# Patient Record
Sex: Male | Born: 1950 | Race: White | Hispanic: No | Marital: Married | Smoking: Never smoker
Health system: Southern US, Community
[De-identification: ages and names within clinical notes are randomized; demographics above are authoritative.]

## PROBLEM LIST (undated history)

## (undated) DIAGNOSIS — I219 Acute myocardial infarction, unspecified: Secondary | ICD-10-CM

## (undated) DIAGNOSIS — E119 Type 2 diabetes mellitus without complications: Secondary | ICD-10-CM

## (undated) DIAGNOSIS — E785 Hyperlipidemia, unspecified: Secondary | ICD-10-CM

## (undated) DIAGNOSIS — I10 Essential (primary) hypertension: Secondary | ICD-10-CM

## (undated) DIAGNOSIS — R0789 Other chest pain: Secondary | ICD-10-CM

## (undated) DIAGNOSIS — G8918 Other acute postprocedural pain: Secondary | ICD-10-CM

## (undated) DIAGNOSIS — I2510 Atherosclerotic heart disease of native coronary artery without angina pectoris: Secondary | ICD-10-CM

## (undated) DIAGNOSIS — R0600 Dyspnea, unspecified: Secondary | ICD-10-CM

## (undated) DIAGNOSIS — J90 Pleural effusion, not elsewhere classified: Secondary | ICD-10-CM

## (undated) HISTORY — DX: Type 2 diabetes mellitus without complications: E11.9

## (undated) HISTORY — DX: Other chest pain: R07.89

## (undated) HISTORY — DX: Other acute postprocedural pain: G89.18

## (undated) HISTORY — DX: Hyperlipidemia, unspecified: E78.5

## (undated) HISTORY — PX: OTHER SURGICAL HISTORY: SHX169

## (undated) HISTORY — DX: Atherosclerotic heart disease of native coronary artery without angina pectoris: I25.10

## (undated) HISTORY — DX: Essential (primary) hypertension: I10

## (undated) HISTORY — DX: Pleural effusion, not elsewhere classified: J90

---

## 1999-11-21 ENCOUNTER — Ambulatory Visit (HOSPITAL_COMMUNITY): Admission: RE | Admit: 1999-11-21 | Discharge: 1999-11-21 | Payer: Self-pay | Admitting: *Deleted

## 1999-11-21 ENCOUNTER — Encounter: Payer: Self-pay | Admitting: *Deleted

## 1999-12-06 ENCOUNTER — Ambulatory Visit (HOSPITAL_COMMUNITY): Admission: RE | Admit: 1999-12-06 | Discharge: 1999-12-06 | Payer: Self-pay | Admitting: *Deleted

## 1999-12-06 ENCOUNTER — Encounter: Payer: Self-pay | Admitting: *Deleted

## 1999-12-20 ENCOUNTER — Ambulatory Visit (HOSPITAL_COMMUNITY): Admission: RE | Admit: 1999-12-20 | Discharge: 1999-12-20 | Payer: Self-pay | Admitting: *Deleted

## 1999-12-20 ENCOUNTER — Encounter: Payer: Self-pay | Admitting: *Deleted

## 2000-04-30 ENCOUNTER — Encounter: Payer: Self-pay | Admitting: Neurosurgery

## 2000-04-30 ENCOUNTER — Ambulatory Visit (HOSPITAL_COMMUNITY): Admission: RE | Admit: 2000-04-30 | Discharge: 2000-04-30 | Payer: Self-pay | Admitting: Neurosurgery

## 2003-10-28 ENCOUNTER — Emergency Department (HOSPITAL_COMMUNITY): Admission: AD | Admit: 2003-10-28 | Discharge: 2003-10-28 | Payer: Self-pay | Admitting: Internal Medicine

## 2005-11-28 ENCOUNTER — Ambulatory Visit (HOSPITAL_COMMUNITY): Admission: RE | Admit: 2005-11-28 | Discharge: 2005-11-28 | Payer: Self-pay | Admitting: Orthopedic Surgery

## 2005-12-29 ENCOUNTER — Encounter: Admission: RE | Admit: 2005-12-29 | Discharge: 2005-12-29 | Payer: Self-pay | Admitting: Neurosurgery

## 2010-03-01 HISTORY — PX: OTHER SURGICAL HISTORY: SHX169

## 2010-03-29 ENCOUNTER — Encounter: Admission: RE | Admit: 2010-03-29 | Discharge: 2010-03-29 | Payer: Self-pay | Admitting: Family Medicine

## 2010-04-23 ENCOUNTER — Encounter: Admission: RE | Admit: 2010-04-23 | Discharge: 2010-04-23 | Payer: Self-pay | Admitting: Orthopedic Surgery

## 2010-04-27 ENCOUNTER — Inpatient Hospital Stay (HOSPITAL_COMMUNITY): Admission: EM | Admit: 2010-04-27 | Discharge: 2010-05-04 | Payer: Self-pay | Admitting: Emergency Medicine

## 2010-04-27 ENCOUNTER — Encounter (INDEPENDENT_AMBULATORY_CARE_PROVIDER_SITE_OTHER): Payer: Self-pay | Admitting: Internal Medicine

## 2010-04-27 HISTORY — PX: TRANSTHORACIC ECHOCARDIOGRAM: SHX275

## 2010-04-29 ENCOUNTER — Encounter: Payer: Self-pay | Admitting: Cardiothoracic Surgery

## 2010-04-29 ENCOUNTER — Ambulatory Visit: Payer: Self-pay | Admitting: Cardiothoracic Surgery

## 2010-04-29 HISTORY — PX: CARDIAC CATHETERIZATION: SHX172

## 2010-04-29 HISTORY — PX: OTHER SURGICAL HISTORY: SHX169

## 2010-04-30 HISTORY — PX: CORONARY ARTERY BYPASS GRAFT: SHX141

## 2010-05-01 HISTORY — PX: OTHER SURGICAL HISTORY: SHX169

## 2010-05-08 ENCOUNTER — Ambulatory Visit: Payer: Self-pay | Admitting: Cardiothoracic Surgery

## 2010-05-08 ENCOUNTER — Encounter: Admission: RE | Admit: 2010-05-08 | Discharge: 2010-05-08 | Payer: Self-pay | Admitting: Cardiothoracic Surgery

## 2010-05-10 ENCOUNTER — Ambulatory Visit: Payer: Self-pay | Admitting: Cardiothoracic Surgery

## 2010-05-13 ENCOUNTER — Encounter: Admission: RE | Admit: 2010-05-13 | Discharge: 2010-05-13 | Payer: Self-pay | Admitting: Cardiovascular Disease

## 2010-05-16 ENCOUNTER — Inpatient Hospital Stay (HOSPITAL_COMMUNITY): Admission: EM | Admit: 2010-05-16 | Discharge: 2010-05-18 | Payer: Self-pay | Admitting: Emergency Medicine

## 2010-05-27 ENCOUNTER — Ambulatory Visit: Payer: Self-pay | Admitting: Cardiothoracic Surgery

## 2010-05-27 ENCOUNTER — Encounter: Admission: RE | Admit: 2010-05-27 | Discharge: 2010-05-27 | Payer: Self-pay | Admitting: Cardiothoracic Surgery

## 2010-06-20 ENCOUNTER — Encounter (HOSPITAL_COMMUNITY)
Admission: RE | Admit: 2010-06-20 | Discharge: 2010-08-17 | Payer: Self-pay | Source: Home / Self Care | Attending: Cardiovascular Disease | Admitting: Cardiovascular Disease

## 2010-07-26 ENCOUNTER — Encounter
Admission: RE | Admit: 2010-07-26 | Discharge: 2010-07-26 | Payer: Self-pay | Source: Home / Self Care | Attending: Cardiovascular Disease | Admitting: Cardiovascular Disease

## 2010-08-14 ENCOUNTER — Ambulatory Visit: Payer: Self-pay | Admitting: Cardiothoracic Surgery

## 2010-09-04 ENCOUNTER — Ambulatory Visit
Admission: RE | Admit: 2010-09-04 | Discharge: 2010-09-04 | Payer: Self-pay | Source: Home / Self Care | Attending: Cardiothoracic Surgery | Admitting: Cardiothoracic Surgery

## 2010-09-08 ENCOUNTER — Encounter: Payer: Self-pay | Admitting: Cardiothoracic Surgery

## 2010-09-11 ENCOUNTER — Ambulatory Visit: Admit: 2010-09-11 | Payer: Self-pay | Admitting: Cardiothoracic Surgery

## 2010-09-25 ENCOUNTER — Ambulatory Visit (INDEPENDENT_AMBULATORY_CARE_PROVIDER_SITE_OTHER): Payer: BC Managed Care – PPO | Admitting: Cardiothoracic Surgery

## 2010-09-25 DIAGNOSIS — I2510 Atherosclerotic heart disease of native coronary artery without angina pectoris: Secondary | ICD-10-CM

## 2010-09-27 NOTE — Assessment & Plan Note (Signed)
OFFICE VISIT  Adrian [name], Adrian [name] DOB:  1951/05/13                                        September 26, 2010 CHART #:  [id number]  CURRENT PROBLEMS: 1. Chest wall pain status post coronary artery bypass graft, April 30, 2010. 2. 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 3. Postoperative left pleural effusion, resolved.  CURRENT MEDICATIONS: 1. Aspirin 81 mg p.o. daily. 2. Lipitor 20 mg daily. 3. Xanax 1.25 mg daily. 4. Losartan 100 mg daily. 5. Oxycodone p.r.n. pain.  HISTORY OF PRESENT ILLNESS:  The patient returns to review and discuss his postoperative chest wall pain.  He has been unable to do any pushing, lifting, and has trouble lifting his arms over his head and has trouble raising his arms to drive extended distances.  This is due to diffuse anterior chest pain.  He is very sensitive to touch.  It is my impression that the amount of clicks that he is experiencing has improved.  However, he is unable to lift or function at his job and he is now unemployed.  He is not taking much narcotic.  We tried him on [name] course of Cymbalta for nerve-type pain; however, this was not effective at all.  In fact, he felt it made his symptoms worse.  The wound is completely healed, and there has never been signs of infection.  He has no evidence of angina and no shortness of breath.  PHYSICAL EXAMINATION:  Blood pressure 150/90, pulse 60, respirations 20, saturation on room air 90%.  Breath sounds are clear.  The sternum is well healed and I elicit no instability.  He is tender over the left greater than right upper chest wall with hypersensitivity as well.  He has no lower extremity edema.  IMPRESSION AND PLAN:  The patient has chest wall pain and inability to use his arms.  We tried nonnarcotic medication as well as Cymbalta without improvement.  We will make referral to Dr. Wynn Banker for evaluation at the pain clinic.  I have offered sternal  rewiring for the minimal amount of manubrial separation; however, the patient declines this and I am not confident that his diffuse chest wall pain would be better with sternal rewiring.  At this point, I have continued to advise him not to lift anything more than 15-20 pounds.  He will probably try to follow for disability and I told him I would provide [name] letter in support of that.  I will plan on seeing him back in approximately 6-8 weeks.  Kerin Perna, M.D. Electronically Signed  PV/MEDQ  D:  09/26/2010  T:  09/27/2010  Job:  [id number]  cc:   Thurmon Fair, MD

## 2010-10-31 LAB — BASIC METABOLIC PANEL
BUN: 16 mg/dL (ref 6–23)
BUN: 22 mg/dL (ref 6–23)
BUN: 22 mg/dL (ref 6–23)
BUN: 22 mg/dL (ref 6–23)
BUN: 12 mg/dL (ref 6–23)
BUN: 13 mg/dL (ref 6–23)
BUN: 16 mg/dL (ref 6–23)
CO2: 25 mEq/L (ref 19–32)
CO2: 26 mEq/L (ref 19–32)
CO2: 28 mEq/L (ref 19–32)
CO2: 30 mEq/L (ref 19–32)
CO2: 25 mEq/L (ref 19–32)
CO2: 28 mEq/L (ref 19–32)
Calcium: 8.3 mg/dL — ABNORMAL LOW (ref 8.4–10.5)
Calcium: 8.4 mg/dL (ref 8.4–10.5)
Calcium: 9.1 mg/dL (ref 8.4–10.5)
Calcium: 9.2 mg/dL (ref 8.4–10.5)
Calcium: 7.9 mg/dL — ABNORMAL LOW (ref 8.4–10.5)
Calcium: 9 mg/dL (ref 8.4–10.5)
Calcium: 9.1 mg/dL (ref 8.4–10.5)
Chloride: 103 mEq/L (ref 96–112)
Chloride: 103 mEq/L (ref 96–112)
Chloride: 104 mEq/L (ref 96–112)
Chloride: 106 mEq/L (ref 96–112)
Chloride: 104 mEq/L (ref 96–112)
Chloride: 111 mEq/L (ref 96–112)
Creatinine, Ser: 0.99 mg/dL (ref 0.4–1.5)
Creatinine, Ser: 1.03 mg/dL (ref 0.4–1.5)
Creatinine, Ser: 1.19 mg/dL (ref 0.4–1.5)
Creatinine, Ser: 1.22 mg/dL (ref 0.4–1.5)
Creatinine, Ser: 0.91 mg/dL (ref 0.4–1.5)
Creatinine, Ser: 1.08 mg/dL (ref 0.4–1.5)
Creatinine, Ser: 1.11 mg/dL (ref 0.4–1.5)
GFR calc Af Amer: >60 mL/min (ref >60)
GFR calc Af Amer: >60 mL/min (ref >60)
GFR calc Af Amer: >60 mL/min (ref >60)
GFR calc Af Amer: >60 mL/min (ref >60)
GFR calc Af Amer: >60 mL/min (ref >60)
GFR calc Af Amer: >60 mL/min (ref >60)
GFR calc Af Amer: >60 mL/min (ref >60)
GFR calc non Af Amer: >60 mL/min (ref >60)
GFR calc non Af Amer: >60 mL/min (ref >60)
GFR calc non Af Amer: >60 mL/min (ref >60)
GFR calc non Af Amer: >60 mL/min (ref >60)
GFR calc non Af Amer: >60 mL/min (ref >60)
GFR calc non Af Amer: >60 mL/min (ref >60)
Glucose, Bld: 108 mg/dL — ABNORMAL HIGH (ref 70–99)
Glucose, Bld: 111 mg/dL — ABNORMAL HIGH (ref 70–99)
Glucose, Bld: 115 mg/dL — ABNORMAL HIGH (ref 70–99)
Glucose, Bld: 121 mg/dL — ABNORMAL HIGH (ref 70–99)
Glucose, Bld: 135 mg/dL — ABNORMAL HIGH (ref 70–99)
Glucose, Bld: 93 mg/dL (ref 70–99)
Potassium: 3.9 mEq/L (ref 3.5–5.1)
Potassium: 4 mEq/L (ref 3.5–5.1)
Potassium: 4.2 mEq/L (ref 3.5–5.1)
Potassium: 3.9 mEq/L (ref 3.5–5.1)
Potassium: 4 mEq/L (ref 3.5–5.1)
Sodium: 137 mEq/L (ref 135–145)
Sodium: 140 mEq/L (ref 135–145)
Sodium: 140 mEq/L (ref 135–145)
Sodium: 140 mEq/L (ref 135–145)
Sodium: 140 mEq/L (ref 135–145)

## 2010-10-31 LAB — POCT I-STAT, CHEM 8
BUN: 13 mg/dL (ref 6–23)
BUN: 29 mg/dL — ABNORMAL HIGH (ref 6–23)
Calcium, Ion: 1.12 mmol/L (ref 1.12–1.32)
Calcium, Ion: 1.19 mmol/L (ref 1.12–1.32)
Creatinine, Ser: 0.9 mg/dL (ref 0.4–1.5)
Creatinine, Ser: 1.1 mg/dL (ref 0.4–1.5)
Glucose, Bld: 115 mg/dL — ABNORMAL HIGH (ref 70–99)
Glucose, Bld: 130 mg/dL — ABNORMAL HIGH (ref 70–99)
HCT: 29 % — ABNORMAL LOW (ref 39.0–52.0)
Hemoglobin: 9.9 g/dL — ABNORMAL LOW (ref 13.0–17.0)
Potassium: 4 mEq/L (ref 3.5–5.1)
Sodium: 141 mEq/L (ref 135–145)
TCO2: 22 mmol/L (ref 0–100)
TCO2: 25 mmol/L (ref 0–100)

## 2010-10-31 LAB — HEPARIN LEVEL (UNFRACTIONATED)
Heparin Unfractionated: 0.16 IU/mL — ABNORMAL LOW (ref 0.30–0.70)
Heparin Unfractionated: 0.27 IU/mL — ABNORMAL LOW (ref 0.30–0.70)
Heparin Unfractionated: 0.31 IU/mL (ref 0.30–0.70)
Heparin Unfractionated: 0.35 IU/mL (ref 0.30–0.70)
Heparin Unfractionated: 0.44 IU/mL (ref 0.30–0.70)
Heparin Unfractionated: <0.1 IU/mL — ABNORMAL LOW (ref 0.30–0.70)
Heparin Unfractionated: <0.1 IU/mL — ABNORMAL LOW (ref 0.30–0.70)

## 2010-10-31 LAB — POCT I-STAT 4, (NA,K, GLUC, HGB,HCT)
Glucose, Bld: 110 mg/dL — ABNORMAL HIGH (ref 70–99)
Glucose, Bld: 110 mg/dL — ABNORMAL HIGH (ref 70–99)
Glucose, Bld: 114 mg/dL — ABNORMAL HIGH (ref 70–99)
Glucose, Bld: 118 mg/dL — ABNORMAL HIGH (ref 70–99)
Glucose, Bld: 121 mg/dL — ABNORMAL HIGH (ref 70–99)
Glucose, Bld: 127 mg/dL — ABNORMAL HIGH (ref 70–99)
HCT: 30 % — ABNORMAL LOW (ref 39.0–52.0)
HCT: 31 % — ABNORMAL LOW (ref 39.0–52.0)
HCT: 32 % — ABNORMAL LOW (ref 39.0–52.0)
HCT: 37 % — ABNORMAL LOW (ref 39.0–52.0)
HCT: 37 % — ABNORMAL LOW (ref 39.0–52.0)
HCT: 37 % — ABNORMAL LOW (ref 39.0–52.0)
Hemoglobin: 10.2 g/dL — ABNORMAL LOW (ref 13.0–17.0)
Hemoglobin: 10.5 g/dL — ABNORMAL LOW (ref 13.0–17.0)
Hemoglobin: 10.5 g/dL — ABNORMAL LOW (ref 13.0–17.0)
Hemoglobin: 10.9 g/dL — ABNORMAL LOW (ref 13.0–17.0)
Hemoglobin: 12.6 g/dL — ABNORMAL LOW (ref 13.0–17.0)
Hemoglobin: 12.6 g/dL — ABNORMAL LOW (ref 13.0–17.0)
Hemoglobin: 12.6 g/dL — ABNORMAL LOW (ref 13.0–17.0)
Potassium: 3.6 mEq/L (ref 3.5–5.1)
Potassium: 3.9 mEq/L (ref 3.5–5.1)
Potassium: 4 mEq/L (ref 3.5–5.1)
Potassium: 4.1 mEq/L (ref 3.5–5.1)
Potassium: 4.6 mEq/L (ref 3.5–5.1)
Potassium: 4.6 mEq/L (ref 3.5–5.1)
Sodium: 136 mEq/L (ref 135–145)
Sodium: 138 mEq/L (ref 135–145)
Sodium: 139 mEq/L (ref 135–145)
Sodium: 140 mEq/L (ref 135–145)
Sodium: 141 mEq/L (ref 135–145)
Sodium: 143 mEq/L (ref 135–145)

## 2010-10-31 LAB — PROTIME-INR
INR: 0.89 (ref 0.00–1.49)
INR: 1.11 (ref 0.00–1.49)
Prothrombin Time: 12.1 seconds (ref 11.6–15.2)
Prothrombin Time: 12.2 seconds (ref 11.6–15.2)
Prothrombin Time: 12.5 seconds (ref 11.6–15.2)
Prothrombin Time: 14.5 seconds (ref 11.6–15.2)

## 2010-10-31 LAB — BRAIN NATRIURETIC PEPTIDE: Pro B Natriuretic peptide (BNP): 53 pg/mL (ref 0.0–100.0)

## 2010-10-31 LAB — POCT I-STAT 3, ART BLOOD GAS (G3+)
Acid-base deficit: 2 mmol/L (ref 0.0–2.0)
Acid-base deficit: 2 mmol/L (ref 0.0–2.0)
Acid-base deficit: 2 mmol/L (ref 0.0–2.0)
Bicarbonate: 21.4 mEq/L (ref 20.0–24.0)
Bicarbonate: 21.9 mEq/L (ref 20.0–24.0)
Bicarbonate: 22.6 mEq/L (ref 20.0–24.0)
Bicarbonate: 23.6 mEq/L (ref 20.0–24.0)
Bicarbonate: 25.2 mEq/L — ABNORMAL HIGH (ref 20.0–24.0)
O2 Saturation: 100 %
O2 Saturation: 91 %
O2 Saturation: 93 %
O2 Saturation: 95 %
Patient temperature: 35.9
Patient temperature: 37.1
TCO2: 23 mmol/L (ref 0–100)
TCO2: 24 mmol/L (ref 0–100)
TCO2: 25 mmol/L (ref 0–100)
TCO2: 27 mmol/L (ref 0–100)
pCO2 arterial: 33.9 mmHg — ABNORMAL LOW (ref 35.0–45.0)
pCO2 arterial: 36.1 mmHg (ref 35.0–45.0)
pCO2 arterial: 40.9 mmHg (ref 35.0–45.0)
pCO2 arterial: 44.3 mmHg (ref 35.0–45.0)
pCO2 arterial: 44.7 mmHg (ref 35.0–45.0)
pH, Arterial: 7.329 — ABNORMAL LOW (ref 7.350–7.450)
pH, Arterial: 7.335 — ABNORMAL LOW (ref 7.350–7.450)
pH, Arterial: 7.359 (ref 7.350–7.450)
pH, Arterial: 7.4 (ref 7.350–7.450)
pH, Arterial: 7.419 (ref 7.350–7.450)
pO2, Arterial: 107 mmHg — ABNORMAL HIGH (ref 80.0–100.0)
pO2, Arterial: 302 mmHg — ABNORMAL HIGH (ref 80.0–100.0)
pO2, Arterial: 60 mmHg — ABNORMAL LOW (ref 80.0–100.0)
pO2, Arterial: 64 mmHg — ABNORMAL LOW (ref 80.0–100.0)
pO2, Arterial: 81 mmHg (ref 80.0–100.0)

## 2010-10-31 LAB — CBC
HCT: 25 % — ABNORMAL LOW (ref 39.0–52.0)
HCT: 26.4 % — ABNORMAL LOW (ref 39.0–52.0)
HCT: 31.7 % — ABNORMAL LOW (ref 39.0–52.0)
HCT: 27.3 % — ABNORMAL LOW (ref 39.0–52.0)
HCT: 28.2 % — ABNORMAL LOW (ref 39.0–52.0)
HCT: 29.7 % — ABNORMAL LOW (ref 39.0–52.0)
HCT: 32.9 % — ABNORMAL LOW (ref 39.0–52.0)
HCT: 41.8 % (ref 39.0–52.0)
Hemoglobin: 10.1 g/dL — ABNORMAL LOW (ref 13.0–17.0)
Hemoglobin: 8.2 g/dL — ABNORMAL LOW (ref 13.0–17.0)
Hemoglobin: 9 g/dL — ABNORMAL LOW (ref 13.0–17.0)
Hemoglobin: 10.4 g/dL — ABNORMAL LOW (ref 13.0–17.0)
Hemoglobin: 11.4 g/dL — ABNORMAL LOW (ref 13.0–17.0)
Hemoglobin: 14.2 g/dL (ref 13.0–17.0)
Hemoglobin: 9.4 g/dL — ABNORMAL LOW (ref 13.0–17.0)
Hemoglobin: 9.5 g/dL — ABNORMAL LOW (ref 13.0–17.0)
MCH: 27.4 pg (ref 26.0–34.0)
MCH: 28.1 pg (ref 26.0–34.0)
MCH: 29.3 pg (ref 26.0–34.0)
MCH: 30.3 pg (ref 26.0–34.0)
MCH: 29.1 pg (ref 26.0–34.0)
MCH: 29.6 pg (ref 26.0–34.0)
MCH: 29.7 pg (ref 26.0–34.0)
MCH: 29.8 pg (ref 26.0–34.0)
MCH: 29.8 pg (ref 26.0–34.0)
MCH: 30 pg (ref 26.0–34.0)
MCHC: 31.9 g/dL (ref 30.0–36.0)
MCHC: 32.8 g/dL (ref 30.0–36.0)
MCHC: 32.8 g/dL (ref 30.0–36.0)
MCHC: 34.1 g/dL (ref 30.0–36.0)
MCHC: 33.3 g/dL (ref 30.0–36.0)
MCHC: 33.7 g/dL (ref 30.0–36.0)
MCHC: 34.2 g/dL (ref 30.0–36.0)
MCHC: 34.4 g/dL (ref 30.0–36.0)
MCHC: 34.7 g/dL (ref 30.0–36.0)
MCHC: 35 g/dL (ref 30.0–36.0)
MCV: 85.7 fL (ref 78.0–100.0)
MCV: 86.1 fL (ref 78.0–100.0)
MCV: 88.9 fL (ref 78.0–100.0)
MCV: 89.3 fL (ref 78.0–100.0)
MCV: 83.9 fL (ref 78.0–100.0)
MCV: 85.6 fL (ref 78.0–100.0)
MCV: 85.7 fL (ref 78.0–100.0)
MCV: 86 fL (ref 78.0–100.0)
MCV: 86.7 fL (ref 78.0–100.0)
MCV: 88.4 fL (ref 78.0–100.0)
Platelets: 120 10*3/uL — ABNORMAL LOW (ref 150–400)
Platelets: 122 10*3/uL — ABNORMAL LOW (ref 150–400)
Platelets: 412 10*3/uL — ABNORMAL HIGH (ref 150–400)
Platelets: 440 10*3/uL — ABNORMAL HIGH (ref 150–400)
Platelets: 151 10*3/uL (ref 150–400)
Platelets: 151 10*3/uL (ref 150–400)
Platelets: 153 10*3/uL (ref 150–400)
Platelets: 161 10*3/uL (ref 150–400)
Platelets: 173 10*3/uL (ref 150–400)
Platelets: 174 10*3/uL (ref 150–400)
RBC: 2.8 MIL/uL — ABNORMAL LOW (ref 4.22–5.81)
RBC: 2.97 MIL/uL — ABNORMAL LOW (ref 4.22–5.81)
RBC: 3.63 MIL/uL — ABNORMAL LOW (ref 4.22–5.81)
RBC: 3.68 MIL/uL — ABNORMAL LOW (ref 4.22–5.81)
RBC: 3.15 MIL/uL — ABNORMAL LOW (ref 4.22–5.81)
RBC: 3.19 MIL/uL — ABNORMAL LOW (ref 4.22–5.81)
RBC: 3.47 MIL/uL — ABNORMAL LOW (ref 4.22–5.81)
RBC: 3.92 MIL/uL — ABNORMAL LOW (ref 4.22–5.81)
RBC: 4.86 MIL/uL (ref 4.22–5.81)
RBC: 4.9 MIL/uL (ref 4.22–5.81)
RDW: 12.9 % (ref 11.5–15.5)
RDW: 13.2 % (ref 11.5–15.5)
RDW: 13.3 % (ref 11.5–15.5)
RDW: 13.6 % (ref 11.5–15.5)
RDW: 13.1 % (ref 11.5–15.5)
RDW: 13.2 % (ref 11.5–15.5)
RDW: 13.2 % (ref 11.5–15.5)
RDW: 13.3 % (ref 11.5–15.5)
RDW: 13.3 % (ref 11.5–15.5)
RDW: 13.6 % (ref 11.5–15.5)
WBC: 10 10*3/uL (ref 4.0–10.5)
WBC: 12 10*3/uL — ABNORMAL HIGH (ref 4.0–10.5)
WBC: 9.4 10*3/uL (ref 4.0–10.5)
WBC: 10.4 10*3/uL (ref 4.0–10.5)
WBC: 10.5 10*3/uL (ref 4.0–10.5)
WBC: 12.5 10*3/uL — ABNORMAL HIGH (ref 4.0–10.5)
WBC: 12.6 10*3/uL — ABNORMAL HIGH (ref 4.0–10.5)
WBC: 13.3 10*3/uL — ABNORMAL HIGH (ref 4.0–10.5)
WBC: 14.1 10*3/uL — ABNORMAL HIGH (ref 4.0–10.5)
WBC: 8.4 10*3/uL (ref 4.0–10.5)

## 2010-10-31 LAB — GLUCOSE, CAPILLARY
Glucose-Capillary: 106 mg/dL — ABNORMAL HIGH (ref 70–99)
Glucose-Capillary: 110 mg/dL — ABNORMAL HIGH (ref 70–99)
Glucose-Capillary: 89 mg/dL (ref 70–99)
Glucose-Capillary: 101 mg/dL — ABNORMAL HIGH (ref 70–99)
Glucose-Capillary: 116 mg/dL — ABNORMAL HIGH (ref 70–99)
Glucose-Capillary: 121 mg/dL — ABNORMAL HIGH (ref 70–99)
Glucose-Capillary: 122 mg/dL — ABNORMAL HIGH (ref 70–99)
Glucose-Capillary: 127 mg/dL — ABNORMAL HIGH (ref 70–99)
Glucose-Capillary: 145 mg/dL — ABNORMAL HIGH (ref 70–99)
Glucose-Capillary: 79 mg/dL (ref 70–99)
Glucose-Capillary: 79 mg/dL (ref 70–99)

## 2010-10-31 LAB — POCT CARDIAC MARKERS: Myoglobin, poc: 49.7 ng/mL (ref 12–200)

## 2010-10-31 LAB — PREPARE PLATELETS

## 2010-10-31 LAB — DIFFERENTIAL
Basophils Relative: 0 % (ref 0–1)
Eosinophils Absolute: 0.1 10*3/uL (ref 0.0–0.7)
Eosinophils Relative: 1 % (ref 0–5)
Eosinophils Relative: 1 % (ref 0–5)
Lymphocytes Relative: 18 % (ref 12–46)
Lymphocytes Relative: 26 % (ref 12–46)
Lymphs Abs: 1.3 10*3/uL (ref 0.7–4.0)
Lymphs Abs: 1.9 10*3/uL (ref 0.7–4.0)
Lymphs Abs: 2.5 10*3/uL (ref 0.7–4.0)
Monocytes Absolute: 0.9 10*3/uL (ref 0.1–1.0)
Monocytes Relative: 8 % (ref 3–12)
Neutro Abs: 6.3 10*3/uL (ref 1.7–7.7)
Neutrophils Relative %: 82 % — ABNORMAL HIGH (ref 43–77)
Neutrophils Relative %: 66 % (ref 43–77)

## 2010-10-31 LAB — HEMOGLOBIN A1C: Hgb A1c MFr Bld: 5.5 % (ref <5.7)

## 2010-10-31 LAB — CREATININE, SERUM
Creatinine, Ser: 0.94 mg/dL (ref 0.4–1.5)
Creatinine, Ser: 1.14 mg/dL (ref 0.4–1.5)
GFR calc Af Amer: >60 mL/min (ref >60)
GFR calc Af Amer: >60 mL/min (ref >60)
GFR calc non Af Amer: >60 mL/min (ref >60)
GFR calc non Af Amer: >60 mL/min (ref >60)

## 2010-10-31 LAB — CK TOTAL AND CKMB (NOT AT ARMC)
CK, MB: 2.7 ng/mL (ref 0.3–4.0)
Relative Index: INVALID (ref 0.0–2.5)
Total CK: 91 U/L (ref 7–232)

## 2010-10-31 LAB — ABO/RH: ABO/RH(D): O NEG

## 2010-10-31 LAB — BLOOD GAS, ARTERIAL
Acid-Base Excess: 1.9 mmol/L (ref 0.0–2.0)
O2 Saturation: 96.1 %
TCO2: 26.9 mmol/L (ref 0–100)
pCO2 arterial: 38.4 mmHg (ref 35.0–45.0)

## 2010-10-31 LAB — CARDIAC PANEL(CRET KIN+CKTOT+MB+TROPI)
CK, MB: 3.1 ng/mL (ref 0.3–4.0)
CK, MB: 3.6 ng/mL (ref 0.3–4.0)
Relative Index: INVALID (ref 0.0–2.5)
Troponin I: 0.31 ng/mL — ABNORMAL HIGH (ref 0.00–0.06)

## 2010-10-31 LAB — MAGNESIUM
Magnesium: 2.4 mg/dL (ref 1.5–2.5)
Magnesium: 2.5 mg/dL (ref 1.5–2.5)
Magnesium: 2.9 mg/dL — ABNORMAL HIGH (ref 1.5–2.5)

## 2010-10-31 LAB — LIPID PANEL
Cholesterol: 223 mg/dL — ABNORMAL HIGH (ref 0–200)
HDL: 55 mg/dL (ref >39)
LDL Cholesterol: 149 mg/dL — ABNORMAL HIGH (ref 0–99)
Total CHOL/HDL Ratio: 4.1 RATIO

## 2010-10-31 LAB — HEMOGLOBIN AND HEMATOCRIT, BLOOD: HCT: 31.6 % — ABNORMAL LOW (ref 39.0–52.0)

## 2010-10-31 LAB — D-DIMER, QUANTITATIVE: D-Dimer, Quant: 0.33 ug/mL-FEU (ref 0.00–0.48)

## 2010-10-31 LAB — TYPE AND SCREEN: ABO/RH(D): O NEG

## 2010-10-31 LAB — POCT I-STAT GLUCOSE
Glucose, Bld: 125 mg/dL — ABNORMAL HIGH (ref 70–99)
Operator id: 195151

## 2010-10-31 LAB — APTT: aPTT: 35 seconds (ref 24–37)

## 2010-10-31 LAB — COMPREHENSIVE METABOLIC PANEL
CO2: 25 mEq/L (ref 19–32)
Calcium: 8.5 mg/dL (ref 8.4–10.5)
Creatinine, Ser: 1.07 mg/dL (ref 0.4–1.5)
GFR calc Af Amer: >60 mL/min (ref >60)
GFR calc non Af Amer: >60 mL/min (ref >60)
Glucose, Bld: 114 mg/dL — ABNORMAL HIGH (ref 70–99)

## 2010-10-31 LAB — TROPONIN I
Troponin I: 0.04 ng/mL (ref 0.00–0.06)
Troponin I: 0.36 ng/mL — ABNORMAL HIGH (ref 0.00–0.06)

## 2010-10-31 LAB — TSH: TSH: 2.073 u[IU]/mL (ref 0.350–4.500)

## 2010-10-31 LAB — PLATELET COUNT: Platelets: 166 10*3/uL (ref 150–400)

## 2010-11-20 ENCOUNTER — Ambulatory Visit (INDEPENDENT_AMBULATORY_CARE_PROVIDER_SITE_OTHER): Payer: BC Managed Care – PPO | Admitting: Cardiothoracic Surgery

## 2010-11-20 DIAGNOSIS — S2329XA Dislocation of other parts of thorax, initial encounter: Secondary | ICD-10-CM

## 2010-11-20 DIAGNOSIS — I2510 Atherosclerotic heart disease of native coronary artery without angina pectoris: Secondary | ICD-10-CM

## 2010-11-21 NOTE — Assessment & Plan Note (Signed)
OFFICE VISIT  Toner, Hope A DOB:  [birth year]                                        November 20, 2010 CHART #:  [id number]  CURRENT PROBLEMS: 1. Chest wall pain, status post coronary artery bypass graft x4,     September 2011. 2. Minimal fibrous malunion of the manubrium documented by previous CT     scan.  PRESENT ILLNESS:  Mr. 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  He states he feels he has slightly improved but has to be very careful over his activities as lifting or pushing will initiate significant pain over his chest wall. He denies any popping or signs of instability.  He denies any symptoms of angina.  He has been referred to the Pain Management Service under the direction of Dr. Jordan Likes and was placed on the lidocaine gel as well as Flexeril and he takes p.r.n. oxycodone maybe one every 3 days.  He is able to do some light activities in the house in the yard but is unable to do any heavy lifting at all without pain.  PHYSICAL EXAMINATION:  Vital Signs:  Blood pressure 150/80, pulse 60, respirations 18, and saturation 99%.  General:  He is alert and stable. Chest:  Breath sounds are clear.  The sternum is well healed and stable but is tender to palpation.  Cardiac:  Rhythm is regular without rub or gallop.  Extremities:  Leg incision is healed.  There is no peripheral edema.  IMPRESSION AND PLAN:  The patient is managing his chest wall pain by limitation of activities and with help from Dr. Jordan Likes.  It appears he is stable and I was glad to know that he is walking for heart healthy activity approximately 15-20 minutes a day.  At this point, I have very little to add but we will continue to follow the patient and we will see him back in approximately 6 months.  Kerin Perna, M.D. Electronically Signed  PV/MEDQ  D:  11/20/2010  T:  11/21/2010  Job:  [id number]  cc:   [facility]

## 2010-12-12 DIAGNOSIS — Z0271 Encounter for disability determination: Secondary | ICD-10-CM

## 2010-12-31 NOTE — Assessment & Plan Note (Signed)
OFFICE VISIT   [name], [name]  DOB:  12/14/1950                                        May 27, 2010  CHART #:  [id number]   REASON FOR OFFICE VISIT:  Followup regarding left thoracentesis for left  pleural effusion.   HISTORY OF PRESENT ILLNESS:  This is a 60 year old Caucasian male who is  status post CABG x4 by Dr. Donata [name] on April 30, 2010.  The patient  had a fairly uneventful hospital course stay.  He was seen in the office  by Dr. Donata [name] on May 16, 2010, and at that time, it was felt to  have intermittent atrial fibrillation.  As a result, he was placed on  amiodarone 200 mg p.o. 2 times daily.  In addition, he did have  complaints as though he felt like he could not take a deep breath.  His  chest x-ray on that day did show bilateral basilar atelectasis and small  bilateral pleural effusions.  The patient then presented to [facility]  Emergency Room on May 16, 2010, with complaints of increasing  dyspnea upon exertion as well as feeling weak.  A chest x-ray and CT  scan of the chest were done.  The patient was found to have an enlarging  left pleural effusion.  No evidence of pulmonary embolus and a small  pericardial effusion.  The patient did undergo an ultrasound-guided  thoracentesis on May 17, 2010.  Approximately, 800 mL of blood-  tinged fluid were removed.  Left pleural fluid culture showed no  organisms or growth.  He was started on a prednisone taper dose.  He was  felt surgically stable for discharge on May 18, 2010.  Currently, the  patient denies any shortness of breath or difficulty taking a deep  breath.  He does also have complaints of feeling tired, fatigued, and  unable to sleep.  He denies any fever, chills, chest pain, or shortness  of breath.   PHYSICAL EXAMINATION:  General:  This is a pleasant 60 year old  Caucasian male accompanied by his wife, who is in no acute distress, who  is alert,  oriented, and cooperative.  Vital Signs:  Latest vital signs  are as follows.  BP 122/71, heart rate 48, respiratory rate 16, and O2  sat 99% on room air.  Cardiovascular:  Bradycardiac.  No murmurs,  gallops, or rubs.  Pulmonary:  Clear to auscultation bilaterally.  No  rales, wheezes, or rhonchi.  Abdomen:  Soft and nontender.  Bowel sounds  present.  Extremities:  No lower extremity edema.  Lower extremity  wounds as well as sternal wounds are clean, dry, and well healed.  No  signs of infection.  Chest:  Sternum is solid.  No sternal instability  appreciated.   DIAGNOSTIC TESTS:  Chest x-ray done today shows continued improvement of  aeration at the bases, trace left pleural effusion with small amount of  atelectasis at the left base.   IMPRESSION AND PLAN:  The patient was found to be bradycardic in the  hospital and as a result, his amiodarone had been decreased to 200 mg  p.o. daily.  He was instructed to continue taking his Lopressor 12.5 mg  p.o. 2 times daily at that time, however, with his heart rate again  being bradycardic, we are going to stop  the beta-blocker for now.  The  patient is to continue with the amiodarone 200 mg p.o. daily.  He has  not seen Dr. Royann [name] in followup by our office.  He is going to contact  Dr. Erin [name] office to hopefully have a followup appointment with him  in 1-2 weeks and have his medications adjusted accordingly.  The patient  was instructed he may begin driving short distances 30 minutes or less  during the day provided he is not taking narcotics.  He was also  encouraged to continue walking daily and was encouraged to participate  in cardiac rehab, although he must still continue with sternal  precautions (no lifting more than 10 pounds for 2-4 more weeks).  The  patient is going to be seen on a p.r.n. basis by Dr. Donata [name] and as  previously stated, we will contact Dr. Erin [name] office for a followup  appointment as he will continue  to be followed by him long term.   Doree Fudge, PA   DZ/MEDQ  D:  05/27/2010  T:  05/28/2010  Job:  [id number]   cc:   Adrian Fair, MD

## 2010-12-31 NOTE — Assessment & Plan Note (Signed)
OFFICE VISIT   Adrian [name], Adrian [name]  DOB:  [birth year]                                        May 08, 2010  CHART #:  [id number]   CURRENT PROBLEMS:  Status post coronary artery bypass grafting x4 on  April 30, 2010, for unstable angina with severe three-vessel  disease.   PRESENT ILLNESS:  The patient returns for an office check after he  called in, complaining of some shortness of breath, weakness, and  nonspecific pains.  He was discharged to the hospital 4 days ago after  multivessel bypass grafting.  He is having more musculoskeletal pain,  which is diffuse.  He denies fever.  He is not coughing, but he feels  like he cannot take [name] deep breath.  He is [name] nonsmoker.  He denies any  drainage from the incisions or edema.   CURRENT MEDICATIONS:  Aspirin, Lopressor 12.5 b.i.d., iron and folic  acid, Lasix and potassium on his last dose today, and pain medication.   He complains of palpitations and has [name] blood pressure/pulse monitor at  home and states his heart rate was as high as 130 and 140 last night.  It sounds like he may be having intermittent atrial fibrillation.   PHYSICAL EXAMINATION:  General:  He appears anxious, but in no distress.  Vital Signs:  Temperature is 97.4, oxygen saturation 98% on room air,  pulse is 60-70 and irregular with some PVCs by rhythm strip and also  with PACs.  His blood pressure is 90/60.  Lungs:  His breath sounds are  clear and equal.  Cardiac:  Rhythm is irregular with some ectopy.  The  sternal incision is healed.  Extremities:  The leg incision is healed.  There is no significant ankle edema.   DIAGNOSTIC TESTS:  A PA and lateral chest x-ray taken today shows  improvement in his bilateral basilar atelectasis and small bilateral  pleural effusions.  The sternal wires are intact.   LABORATORY DATA:  Labs drawn today in the downstairs lab indicate [name]  potassium of 5.0, BUN of 26, creatinine 1.2, glucose of 92,  sodium 138,  and bicarb 23.  LFTs normal.  Albumin 4.2.  His CBC indicates [name]  hematocrit of 33.7, white count of 15,000, and platelet count of  500,000.   IMPRESSION:  The patient feels poorly since his return to home and  probably is having episodes of intermittent atrial fibrillation  according to his history.  I have reassured him that his lungs are  clearing on x-ray and his sensation of difficulty breathing is due to  incisional discomfort.  I have started him on amiodarone 400 mg p.o.  b.i.d. for 3 days, then 200 mg p.o. b.i.d.  He will hold the Lopressor  for the first 48 hours while the amiodarone load is starting as he did  have [name] low heart rate in the hospital.  I also told him to take some  MiraLax for his constipation and provided him with [name] repeat prescription  of OxyIR.  He was also recommended to increase his p.o. fluid intake due  to his elevated BUN and mildly low blood pressure.  He will return for [name]  followup in approximately 10 days.   Kerin Perna, M.D.  Electronically Signed   PV/MEDQ  D:  05/08/2010  T:  05/09/2010  Job:  [id number]   cc:   Timmothy Euler, MD

## 2010-12-31 NOTE — Consult Note (Signed)
NEW PATIENT CONSULTATION   [name], [name]  DOB:  1951-01-17                                        September 04, 2010  CHART #:  [id number]   CURRENT PROBLEMS:  1. Chest wall pain status post CABG, April 30, 2010.  2. 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  3. Postoperative left pleural effusion, resolved.   CURRENT MEDICATIONS:  1. Aspirin 325 mg.  2. Lipitor 20 mg daily.  3. Sertraline 1 tablet daily.  4. Xanax p.r.n.  5. Losartan 1 tablet daily.   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  He  initially noted some clicking instability of his sternal incision and a  CT scan performed a month ago showed 4-mm separation of the manubrium  with sternal wires intact.  Otherwise, the sternum was healing.  He  continues to have difficulty bending over and the chest well as  exclusively sensitive and tender.  There is no sign of infection.  He  has been unable to return to work, and has been out on short-term  disability.  He was last examined on December 28, at which time he was  recommended not to lift anything more than 20 pounds with a plan to  reexamine him after another month to see if there had been any gain in  the stability of the sternum with ongoing fibrosis and healing of the  sternum.  A chest x-ray was not taken today.   PHYSICAL EXAMINATION:  Vital Signs:  Blood pressure 140/80, pulse 60,  saturation 98%.  General:  He is anxious.  I was unable to elicit any  click or sign of instability of the sternum today despite the patient  maneuvering his torso and trunk.  He is however exquisitely tender,  especially on the left side of the sternum.  Incision is healing  well  without sign of infection.  His cardiac rhythm is regular and there is  no murmur.  The lower extremity incisions are healed.  There is no  peripheral edema.   IMPRESSION AND PLAN:  The patient appears to have no clinical  instability of the sternum.  The fibrous malunion was minimal on the CT  scan.  His main issue is hypersensitivity and chest wall pain.  It was a  neuritic type of pattern and rather than the sternal rewiring, I would  recommend a course of Lyrica 150 mg once a day, then 150 mg b.i.d.  The  patient was provided with a prescription for this and I will check him  back in 2-3 weeks to check his recovery.  I would not recommend sternal  rewiring this time since this sternal clicking is actually improved over  the past month on physical exam.  He was recommend to continue to avoid  lifting more than 20 pounds and I gave another note for work, leave of  absence until mid February.   Kerin Perna, M.D.  Electronically Signed   PV/MEDQ  D:  09/04/2010  T:  09/05/2010  Job:  [id number]   cc:   Thurmon Fair, MD

## 2010-12-31 NOTE — Assessment & Plan Note (Signed)
OFFICE VISIT   Prisk, Stanley A  DOB:  1951/07/09                                        August 14, 2010  CHART #:  [id number]   CURRENT PROBLEMS:  1,  Status post coronary artery bypass graft x4 on  April 30, 2010, for unstable angina with three-vessel disease.  1. Postoperative left pleural effusion, resolved.  2. Mild sternal malunion of the manubrium without sternal wire      fracture or displacement.   CURRENT MEDICATIONS:  1. Aspirin 81 mg a day.  2. Lipitor 20 mg a day.  3. Losartan 1 tablet daily.  4. Vitamins.   PRESENT ILLNESS:  The patient is now 4 months after multivessel bypass  grafting.  He is progressing well in rehab and is feeling well without  recurrent angina symptoms of CHF and has been progressing his activity  level at rehab.  He has not returned back to work yet.  He did note some  changes with some clicking of the upper right chest along the sternal  incision as well as pain when he lays on his left side or pushes himself  out of bed in the morning.  He has some pain in his chest incision when  he lifts his arms well above the head especially when lifting an object.  The incision healed well.  A CT scan of the chest approximately 3 weeks  after surgery showed the sternal wires to be intact and the sternum to  be well opposed but with a left pleural effusion which was subsequently  treated with a thoracentesis.  A followup CT scan performed in early  December showed slight sternal malunion of the manubrium with the  separated sternal edges in the manubrium 1-2 mm apart.  There is no sign  of infection and the pleural effusion remains resolved.  He presents  today to discuss the CT scan and to discuss his sternal incisional pain  problems.  He was not taking any narcotics or Tylenol.  The pain is  usually related to motion or movement and then resolves quickly.   PHYSICAL EXAMINATION:  Blood pressure is 120/70, pulse 60,  saturation  96% on room air.  Weight 209 pounds.  He is alert and pleasant.  Breath  sounds are clear.  The sternum is well healed.  I can elicit no  instability with direct pressure or palpation.  However, when he twists  his trunk to the right with a significant torque, a click is faintly  palpable.  Otherwise, his abdominal exam is benign.  His leg incisions  are healed, and there is no peripheral edema.   IMAGING:  I reviewed the CT scan findings which demonstrated a very  minimal amount of fibrous malunion of the manubrium only.  The wires are  not displaced or fractured.   IMPRESSION AND PLAN:  The patient has some symptoms from a very mild  fibrous malunion.  I explained to him there is no structural risk of the  incision opening or the wires breaking.  He should be able to progress  with lifting over 20 pounds now without jeopardizing the integrity of  his chest incision, but he may feel pain.  I told him that if the pain  is severe or interferes with his normal daily activities, then sternal  rewiring could be performed, but I would recommend it at this time as  the fibrous malunion still has a good chance of getting better with  time.  He will stay out of work until late January, and I will see him  on September 11, 2009, which chest x-ray to reassess the situation.   Kerin Perna, M.D.  Electronically Signed   PV/MEDQ  D:  08/14/2010  T:  08/15/2010  Job:  [id number]   cc:   Thurmon Fair, MD  [facility].

## 2011-01-03 NOTE — Op Note (Signed)
[place]  Patient:    Adrian [name], Adrian [name]                       MRN: [id number] Proc. Date: 04/30/00 Adm. Date:  [id number] Disc. 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  OPERATION PERFORMED: 1. Right L4-5 interlaminal laminotomy for excision of herniated disk with the    operating microscope. 2. Microdissection, L4-5 disk and L5 nerve roots.  SURGEON:  Reinaldo Meeker, M.D.  ASSISTANT:  Donzetta Sprung. Roney Jaffe., M.D.  ANESTHESIA:  DESCRIPTION OF PROCEDURE:  After being placed in the prone position, the patients back was prepped and draped in the usual sterile fashion.  A localizing x-ray was taken prior to incision to identify the L4-5 level.  A midline incision made above the spinous processes of L4 and L5.  Using Bovie cutting current, the incision was carried down to the spinous processes. Subperiosteal dissection was then carried out on the right sided spinous processes and lamina and a McCullough self-retaining retractor was placed for exposure.  A second x-ray was taken to confirm approach to the L4-5 level and this was correct.  Using high speed drill, the inferior one third of the L4 lamina and the medial one third of the facet joint were removed.  A drill was then used to ____________ superior one third of the L5 lamina.   Residual bone and ligamentum flavum were removed in a piecemeal fashion.  The microscope was draped and brought into the field and used for the remainder of the case.  Using microdissection technique, the lateral aspect of the thecal sac and L5 nerve root were identified. Further coagulation was carried out down to floor of the canal to identify the L4-5 disk space ____________ centrally herniated.  After coagulating on the annulus, the  annulus was incised with a 15 blade.  Using pituitary rongeurs and curets, a very thorough disk space clean out was carried out.  At the same time great care was taken to avoid injury to avoid the neural elements and this was ____________ done. When all the disk material had been removed that could be removed, inspection was carried out in all directions for any evidence of residual compression and none could be identified.  Large amounts of irrigation were carried out and any bleeding controlled with bipolar coagulation and Gelfoam.  The wound was then closed using Vicryl on the muscle fascia, subcutaneous and subcuticular tissues and staples on the skin. Sterile dressing was then applied.  Patient was extubated and taken to the recovery room in stable condition. DD:  04/30/00 TD:  05/02/00 Job: [id number]

## 2011-02-12 ENCOUNTER — Ambulatory Visit: Payer: BC Managed Care – PPO | Admitting: Cardiothoracic Surgery

## 2011-06-12 ENCOUNTER — Encounter: Payer: Self-pay | Admitting: Cardiothoracic Surgery

## 2011-06-12 ENCOUNTER — Other Ambulatory Visit: Payer: Self-pay | Admitting: Cardiothoracic Surgery

## 2011-06-12 DIAGNOSIS — I2510 Atherosclerotic heart disease of native coronary artery without angina pectoris: Secondary | ICD-10-CM

## 2011-06-17 DIAGNOSIS — Z951 Presence of aortocoronary bypass graft: Secondary | ICD-10-CM | POA: Insufficient documentation

## 2011-06-17 DIAGNOSIS — J90 Pleural effusion, not elsewhere classified: Secondary | ICD-10-CM | POA: Insufficient documentation

## 2011-06-17 DIAGNOSIS — R0789 Other chest pain: Secondary | ICD-10-CM

## 2011-06-17 DIAGNOSIS — G8918 Other acute postprocedural pain: Secondary | ICD-10-CM | POA: Insufficient documentation

## 2011-06-18 ENCOUNTER — Encounter: Payer: Self-pay | Admitting: Cardiothoracic Surgery

## 2011-06-18 ENCOUNTER — Ambulatory Visit
Admission: RE | Admit: 2011-06-18 | Discharge: 2011-06-18 | Disposition: A | Discharge status: Home / Self Care | Payer: BC Managed Care – PPO | Source: Ambulatory Visit | Attending: Cardiothoracic Surgery | Admitting: Cardiothoracic Surgery

## 2011-06-18 ENCOUNTER — Other Ambulatory Visit: Payer: Self-pay | Admitting: Cardiothoracic Surgery

## 2011-06-18 ENCOUNTER — Ambulatory Visit (INDEPENDENT_AMBULATORY_CARE_PROVIDER_SITE_OTHER): Payer: BC Managed Care – PPO | Admitting: Cardiothoracic Surgery

## 2011-06-18 VITALS — BP 149/82 | HR 67 | Resp 20 | Ht 73.0 in | Wt 234.0 lb

## 2011-06-18 DIAGNOSIS — G8912 Acute post-thoracotomy pain: Secondary | ICD-10-CM

## 2011-06-18 DIAGNOSIS — I2510 Atherosclerotic heart disease of native coronary artery without angina pectoris: Secondary | ICD-10-CM

## 2011-06-18 DIAGNOSIS — R918 Other nonspecific abnormal finding of lung field: Secondary | ICD-10-CM

## 2011-06-18 DIAGNOSIS — Z951 Presence of aortocoronary bypass graft: Secondary | ICD-10-CM

## 2011-06-18 NOTE — Progress Notes (Signed)
                   [address]            Adrian [name] [address]          [phone]    HPI: Returns for 6 month followup of the sternal chest wall pain after multivessel bypass grafting September 2011. CT scan of the passage shown a minimal 45 mm separation of the manubrium otherwise the sternum is well-healed. There is been no evidence of infection. The patient has positional chest soreness with pushing but cannot pickup objects up to 20 pounds. He is sleeping better. He denies angina. He elected not to take any neuritic-type oral pain medication. Is not taking oxycodone. Overall he appears improved.  2-3 months ago he had a prolonged episode of severe coughing probable bronchiolitis. He is a nonsmoker. A chest x-ray taken today shows a infiltrated density in the right middle lobe consistent with probable bronchopneumonia possibly resolving. Previous CT scan of the chest to examine the sternum is indicated some stable small pulmonary nodules in the past.  Current Outpatient Prescriptions  Medication Sig Dispense Refill  . aspirin 81 MG tablet Take 81 mg by mouth daily.        Marland Kitchen atorvastatin (LIPITOR) 20 MG tablet Take 20 mg by mouth daily.        Marland Kitchen losartan (COZAAR) 100 MG tablet Take 100 mg by mouth daily.       Marland Kitchen oxycodone (OXY-IR) 5 MG capsule Take 5 mg by mouth every 4 (four) hours as needed.           Review of Systems: The patient denies fever or night sweats or weight loss. The sternal incision remains clean and dry and well-healed. He is gaining weight. He is walking as much as he should. Physical Exam Vital signs are stable he is afebrile oxygen saturation greater than 97% on room air. LUNGS clear breath sounds no sternal tenderness no sternal instability. CARDIAC regular rate rhythm without murmur or gallop NEURO no focal motor weakness.  Diagnostic Tests: Chest x-ray today shows a infiltrated density in the right middle lung zone  consistent with possible resolving bronchitis.  Impression: Improved symptomatology regarding sternal incisional pain. Infiltrated density in the right lung following prior 8-12 weeks history of upper respiratory infection.  Plan: We will treat the patient with a course of azithromycin and repeat a CT scan of the chest in 2 weeks. We will examine his sternum as well as the right lung infiltrated density.

## 2011-06-18 NOTE — Patient Instructions (Signed)
Finish Z-pack and reurn for CT scan

## 2011-07-02 ENCOUNTER — Ambulatory Visit (INDEPENDENT_AMBULATORY_CARE_PROVIDER_SITE_OTHER): Payer: BC Managed Care – PPO | Admitting: Cardiothoracic Surgery

## 2011-07-02 ENCOUNTER — Encounter: Payer: Self-pay | Admitting: Cardiothoracic Surgery

## 2011-07-02 ENCOUNTER — Ambulatory Visit
Admission: RE | Admit: 2011-07-02 | Discharge: 2011-07-02 | Disposition: A | Discharge status: Home / Self Care | Payer: BC Managed Care – PPO | Source: Ambulatory Visit | Attending: Cardiothoracic Surgery | Admitting: Cardiothoracic Surgery

## 2011-07-02 VITALS — BP 140/85 | HR 70 | Resp 20 | Ht 73.0 in | Wt 234.0 lb

## 2011-07-02 DIAGNOSIS — R918 Other nonspecific abnormal finding of lung field: Secondary | ICD-10-CM

## 2011-07-02 DIAGNOSIS — G8912 Acute post-thoracotomy pain: Secondary | ICD-10-CM

## 2011-07-02 DIAGNOSIS — I2510 Atherosclerotic heart disease of native coronary artery without angina pectoris: Secondary | ICD-10-CM

## 2011-07-02 DIAGNOSIS — Z951 Presence of aortocoronary bypass graft: Secondary | ICD-10-CM

## 2011-07-02 MED ORDER — IOHEXOL 300 MG/ML  SOLN: 75.0000 mL | Freq: Once | INTRAMUSCULAR | Status: AC | PRN

## 2011-07-02 NOTE — Patient Instructions (Signed)
Return as needed. The sternum appears to be fully healed on the CT chest done today

## 2011-07-02 NOTE — Progress Notes (Signed)
HPI: The patient returns for a followup CT scan to evaluate a mid right lung density seen on his last chest x-ray. The patient is been followed extensively for poststernotomy tenderness. At one point it was felt to be a fibrous malunion however on most recent exams there is no instability at all of the sternum and the tenderness and pain had progressively decreased. The patient is having no angina.  At the time of his last evaluation of the sternal situation a chest x-ray showed a infiltrated density in the right middle lung zone. The patient had recently at that time completed a course of oral antibiotics for upper respiratory infection. To be sure this was not a solid tumor a CT scan of the chest was obtained.  Current Outpatient Prescriptions  Medication Sig Dispense Refill  . aspirin 81 MG tablet Take 81 mg by mouth daily.        Marland Kitchen atorvastatin (LIPITOR) 20 MG tablet Take 20 mg by mouth daily.        Marland Kitchen losartan (COZAAR) 100 MG tablet Take 100 mg by mouth daily.       Marland Kitchen oxycodone (OXY-IR) 5 MG capsule Take 5 mg by mouth every 4 (four) hours as needed.         No current facility-administered medications for this visit.   Facility-Administered Medications Ordered in Other Visits  Medication Dose Route Frequency Provider Last Rate Last Dose  . iohexol (OMNIPAQUE) 300 MG/ML injection 75 mL  75 mL Intravenous Once PRN Medication Radiologist         Physical Exam: Normal sinus rhythm, blood pressure 118/70, oxygen saturation 95%. Breath sounds clear and equal. No adenopathy the neck. Heart rhythm regular without gallop or murmur.   Diagnostic Tests: CT scan of the chest shows clear lung fields. Of note the sternum is completely healed without evidence of fibrous malunion and all the wires are intact. There is no pleural effusion.  Impression: Normal CT scan resolution of pulmonary symptoms. No evidence of sternal malunion on CT scan today.  Plan: Return to his medical doctors return  here as needed. No prescriptions provided.

## 2012-03-19 IMAGING — CR DG CHEST 2V
2 series · 2 of 2 positions shown · non-contrast
Comparison: None

CLINICAL DATA: Fatigue and chest pain.

CHEST - 2 VIEW

[view not recorded (1 of 2)]
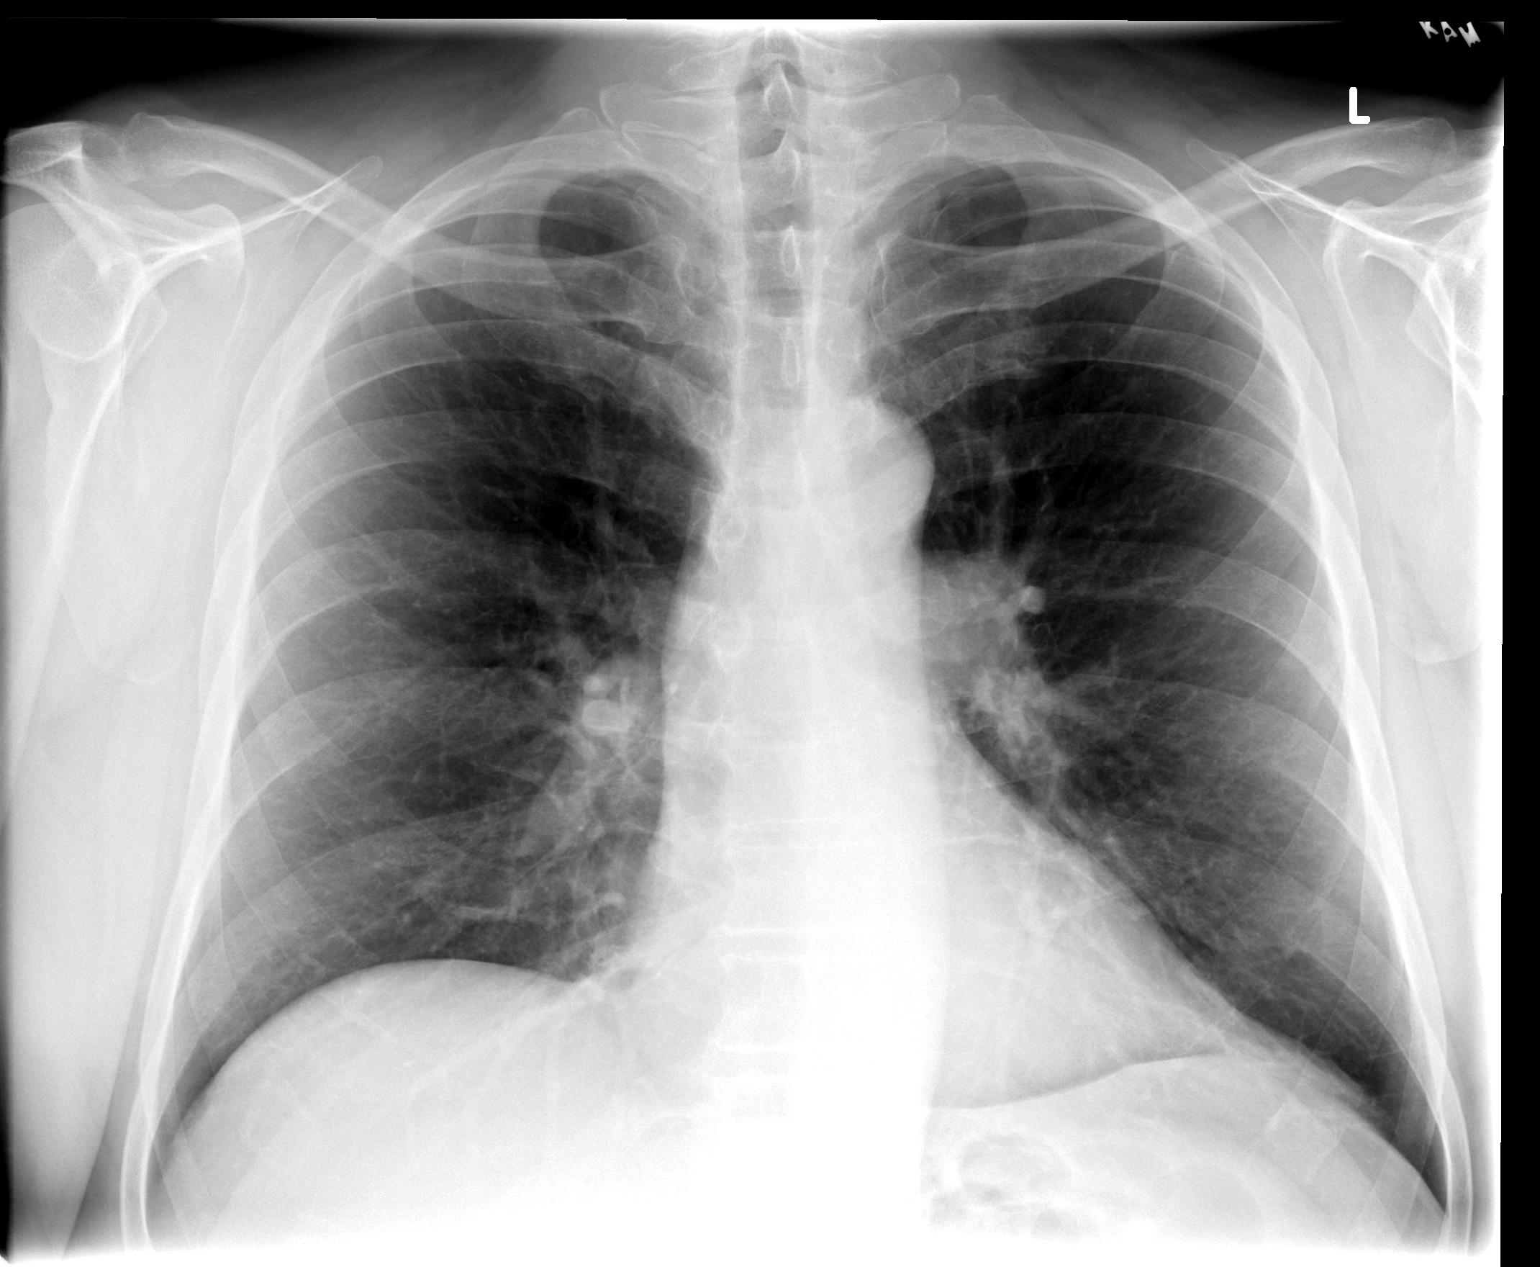

[view not recorded (2 of 2)]
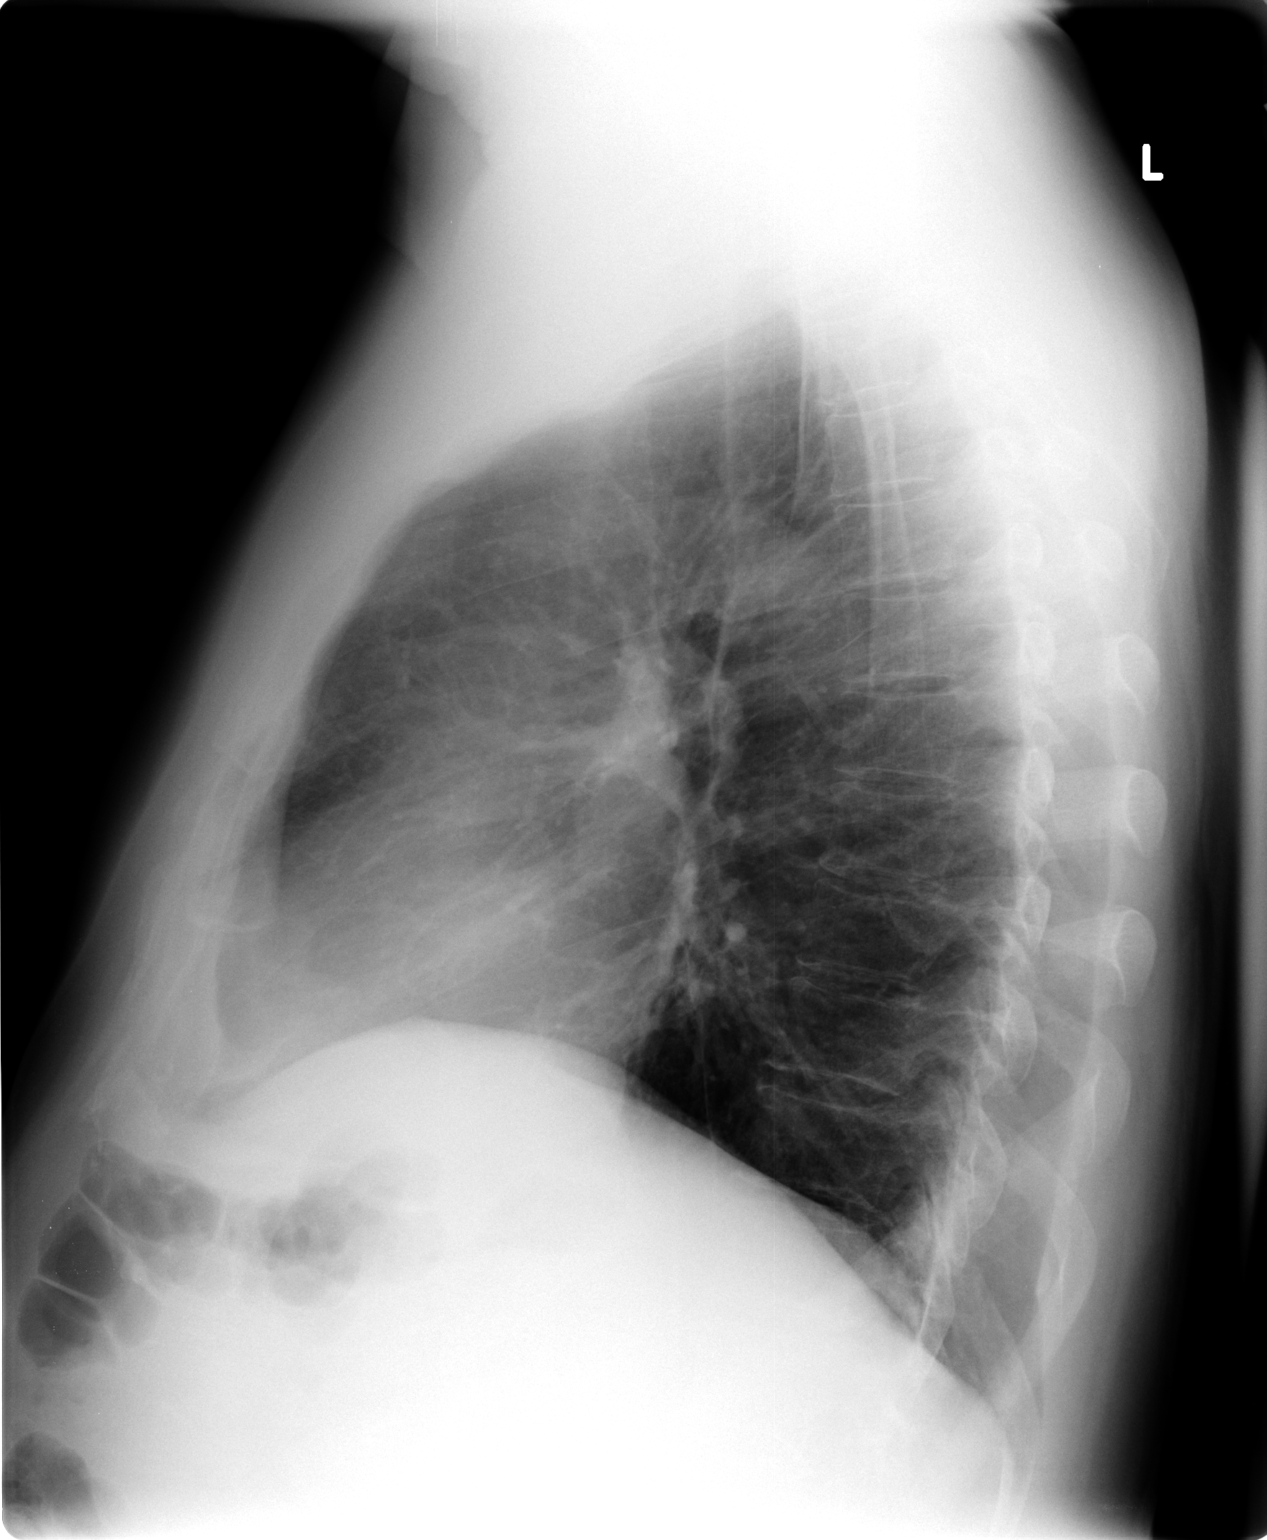

[2 of 2 positions shown; findings below may reference images not displayed]

FINDINGS: The cardiac silhouette, mediastinal and hilar contours
are within normal limits.  The lungs are clear.  The bony thorax is
intact.
IMPRESSION: No acute cardiopulmonary findings.

## 2014-02-23 ENCOUNTER — Encounter: Payer: Self-pay | Admitting: *Deleted

## 2014-03-01 ENCOUNTER — Encounter: Payer: Self-pay | Admitting: Cardiovascular Disease

## 2014-03-02 ENCOUNTER — Ambulatory Visit: Payer: BC Managed Care – PPO | Admitting: Cardiovascular Disease

## 2014-03-17 ENCOUNTER — Encounter: Payer: Self-pay | Admitting: Cardiovascular Disease

## 2014-03-17 ENCOUNTER — Ambulatory Visit (INDEPENDENT_AMBULATORY_CARE_PROVIDER_SITE_OTHER): Payer: Medicare Other | Admitting: Cardiovascular Disease

## 2014-03-17 VITALS — BP 138/86 | HR 65 | Ht 69.0 in | Wt 244.6 lb

## 2014-03-17 DIAGNOSIS — I2510 Atherosclerotic heart disease of native coronary artery without angina pectoris: Secondary | ICD-10-CM

## 2014-03-17 DIAGNOSIS — E782 Mixed hyperlipidemia: Secondary | ICD-10-CM

## 2014-03-17 DIAGNOSIS — I10 Essential (primary) hypertension: Secondary | ICD-10-CM

## 2014-03-17 NOTE — Patient Instructions (Addendum)
Your physician has recommended you make the following change in your medication..  - STOP atorvastatin for 6 weeks - call us after 6 weeks to let us know how your leg pain is  Your physician wants you to follow-up in: 1 year with Dr. Royann Shiversroitoru. You will receive a reminder letter in the mail two months in advance. If you don't receive a letter, please call our office to schedule the follow-up appointment.

## 2014-03-19 DIAGNOSIS — E785 Hyperlipidemia, unspecified: Secondary | ICD-10-CM | POA: Insufficient documentation

## 2014-03-19 DIAGNOSIS — I10 Essential (primary) hypertension: Secondary | ICD-10-CM | POA: Insufficient documentation

## 2014-03-19 NOTE — Progress Notes (Signed)
Patient ID: Adrian [name], male   DOB: 1951-03-19, 63 y.o.   MRN: [id number]      Reason for office visit Chest pain, CAD status post CABG  Adrian [name] has had episodes of sharp chest pain that occurred while he was sitting on the couch watching TV, sometimes waking him from sleep. He also reports occasional shortness of breath when walking and has some trouble with his equilibrium. He has pain in his legs that prevents her from sleeping. Often wakes him up in the second part of the night.  Adrian [name] is moderate to severely obese and has hypertension and hyperlipidemia and multivessel coronary disease for which he underwent bypass surgery in September 2011 (LIMA to LAD, SVG to ramus intermediate, SVG to circumflex marginal, SVG to posterior descending branch of the right coronary artery, EVH from a left side by Dr. Donata ClayVan Trigt ).  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 Indeed a followup CT of the chest which was performed to screen for a lung mass showed complete healing of the sternum. No lung mass was found.  Has had long-standing problems with low back pain and previous lumbar laminectomy.  No Known Allergies  Current Outpatient Prescriptions  Medication Sig Dispense Refill  . aspirin 81 MG tablet Take 81 mg by mouth daily.        Marland Kitchen. atorvastatin (LIPITOR) 20 MG tablet Take 20 mg by mouth daily.        Marland Kitchen. losartan (COZAAR) 100 MG tablet Take 100 mg by mouth daily.       Marland Kitchen. oxycodone (OXY-IR) 5 MG capsule Take 5 mg by mouth every 4 (four) hours as needed.         No current facility-administered medications for this visit.    Past Medical History  Diagnosis Date  . CAD (coronary artery disease)   . Pleural effusion, left   . Chest wall pain following surgery   . Hypertension   . Hyperlipidemia   . Diabetes mellitus without complication     Past Surgical  History  Procedure Laterality Date  . Coronary artery bypass grafting  05/01/2010  . Lumbar laminectomy in 2006 by dr. Pennie Banterkritze    . Transthoracic echocardiogram N/A 04/27/10    LV SIZE IS NORMAL. SYSTOLIC FUNC IS NORMAL.  . Nuclear stress test N/A 03/01/10    NORMAL PERFUSION PATTERN IN ALL REGIONS. EF 69%.NL MYOCARDIAL PERFUSION STUDY.  . Cardiac catheterization  04/29/10    LEFT MAIN-FAIRLY SHORT VESSEL, FREE OF DISEASE. LAD 70-90%. A CERTAIN SEGMENT APPEARS SLIGHTLY ULCERATED, THERE MAYBE ACTIVE RUPTURED PLAQUE. RAMUS INTERMEDIUS 95% IN THE PROXIMAL THIRD. LEFT CX 70-80% STENOSIS IN THE MID OM. RCA 40-50% OSTIAL STENOSIS. REFERRED FOR CABG.  . Carotid duplex Bilateral 04/29/10    NO ICA STENOSIS BIL.+  . Coronary artery bypass graft  04/30/10    X4    No family history on file.  History   Social History  . Marital Status: Married    Spouse Name: N/A    Number of Children: N/A  . Years of Education: N/A   Occupational History  . Not on file.   Social History Main Topics  . Smoking status: Never Smoker   . Smokeless tobacco: Never Used  . Alcohol Use: Yes  . Drug Use: No  . Sexual Activity: Not on file   Other Topics Concern  . Not on  file   Social History Narrative  . No narrative on file    Review of systems: The patient specifically denies any chest pain with exertion, dyspnea at rest, orthopnea, paroxysmal nocturnal dyspnea, syncope, palpitations, focal neurological deficits, intermittent claudication, lower extremity edema, unexplained weight gain, cough, hemoptysis or wheezing.   PHYSICAL EXAM BP 138/86  Pulse 65  Ht 5\' 9"  (1.753 m)  Wt 244 lb 9.6 oz (110.95 kg)  BMI 36.10 kg/m2  General: Alert, oriented x3, no distress Head: no evidence of trauma, PERRL, EOMI, no exophtalmos or lid lag, no myxedema, no xanthelasma; normal ears, nose and oropharynx Neck: normal jugular venous pulsations and no hepatojugular reflux; brisk carotid pulses without delay and no  carotid bruits Chest: clear to auscultation, no signs of consolidation by percussion or palpation, normal fremitus, symmetrical and full respiratory excursions, sternotomy scar Cardiovascular: normal position and quality of the apical impulse, regular rhythm, normal first and second heart sounds, no murmurs, rubs or gallops Abdomen: no tenderness or distention, no masses by palpation, no abnormal pulsatility or arterial bruits, normal bowel sounds, no hepatosplenomegaly Extremities: Moderately prominent bilateral varicose veins no clubbing, cyanosis or edema; 2+ radial, ulnar and brachial pulses bilaterally; 2+ right femoral, posterior tibial and dorsalis pedis pulses; 2+ left femoral, posterior tibial and dorsalis pedis pulses; no subclavian or femoral bruits Neurological: grossly nonfocal   EKG: Normal sinus rhythm, incomplete right bundle branch block, nonspecific ST segment changes with less than 1 mm ST depression in leads 1 and aVL, similar to previous tracings  Lipid Panel  May 2015 creatinine 1.01, glucose 95, normal LFTs, total cholesterol 166, triglycerides 251, HDL 41, LDL 78   ASSESSMENT AND PLAN CAD (coronary artery disease) The symptoms are clearly not anginal, are most likely musculoskeletal.  Mixed hyperlipidemia Mild hypertriglyceridemia and borderline HDL would improve with weight loss and physical exercise. LDL level was acceptable  Benign essential HTN Well controlled  I think his leg pain is nonvascular. He has excellent pulses and no symptoms with walking. He does have varicose veins but one would expect the symptoms to improve with overnight recumbent position. I suspect the issue is again related to his lower spine where he has had previous surgery.  On the other hand , it is reasonable to have him take a "statin holiday" to make sure that his pain is not a sign of statin myopathy. If symptoms do not improve in 4-6 weeks he should resume taking the statin.  Patient  Instructions  Your physician has recommended you make the following change in your medication..  - STOP atorvastatin for 6 weeks - call us after 6 weeks to let us know how your leg pain is  Your physician wants you to follow-up in: 1 year with Dr. Royann Shivers. You will receive a reminder letter in the mail two months in advance. If you don't receive a letter, please call our office to schedule the follow-up appointment.      No orders of the defined types were placed in this encounter.   No orders of the defined types were placed in this encounter.    Junious Silk, MD, [facility] [phone] office [phone] pager

## 2014-03-19 NOTE — Assessment & Plan Note (Signed)
Well controlled 

## 2014-03-19 NOTE — Assessment & Plan Note (Signed)
The symptoms are clearly not anginal, are most likely musculoskeletal.

## 2014-03-19 NOTE — Assessment & Plan Note (Signed)
Mild hypertriglyceridemia and borderline HDL would improve with weight loss and physical exercise. LDL level was acceptable

## 2014-04-10 ENCOUNTER — Ambulatory Visit: Payer: BC Managed Care – PPO | Admitting: Cardiovascular Disease

## 2015-10-10 DIAGNOSIS — E7800 Pure hypercholesterolemia, unspecified: Secondary | ICD-10-CM | POA: Diagnosis not present

## 2015-10-10 DIAGNOSIS — I119 Hypertensive heart disease without heart failure: Secondary | ICD-10-CM | POA: Diagnosis not present

## 2015-10-10 DIAGNOSIS — I2510 Atherosclerotic heart disease of native coronary artery without angina pectoris: Secondary | ICD-10-CM | POA: Diagnosis not present

## 2015-10-10 DIAGNOSIS — M25569 Pain in unspecified knee: Secondary | ICD-10-CM | POA: Diagnosis not present

## 2015-10-23 DIAGNOSIS — M25562 Pain in left knee: Secondary | ICD-10-CM | POA: Diagnosis not present

## 2015-10-23 DIAGNOSIS — M25561 Pain in right knee: Secondary | ICD-10-CM | POA: Diagnosis not present

## 2016-04-02 DIAGNOSIS — D229 Melanocytic nevi, unspecified: Secondary | ICD-10-CM | POA: Diagnosis not present

## 2016-04-02 DIAGNOSIS — Z23 Encounter for immunization: Secondary | ICD-10-CM | POA: Diagnosis not present

## 2016-04-02 DIAGNOSIS — E7800 Pure hypercholesterolemia, unspecified: Secondary | ICD-10-CM | POA: Diagnosis not present

## 2016-04-02 DIAGNOSIS — I2510 Atherosclerotic heart disease of native coronary artery without angina pectoris: Secondary | ICD-10-CM | POA: Diagnosis not present

## 2016-04-02 DIAGNOSIS — Z125 Encounter for screening for malignant neoplasm of prostate: Secondary | ICD-10-CM | POA: Diagnosis not present

## 2016-04-02 DIAGNOSIS — I119 Hypertensive heart disease without heart failure: Secondary | ICD-10-CM | POA: Diagnosis not present

## 2016-04-02 DIAGNOSIS — Z0000 Encounter for general adult medical examination without abnormal findings: Secondary | ICD-10-CM | POA: Diagnosis not present

## 2016-04-15 DIAGNOSIS — Z1283 Encounter for screening for malignant neoplasm of skin: Secondary | ICD-10-CM | POA: Diagnosis not present

## 2016-04-15 DIAGNOSIS — L820 Inflamed seborrheic keratosis: Secondary | ICD-10-CM | POA: Diagnosis not present

## 2016-04-15 DIAGNOSIS — L821 Other seborrheic keratosis: Secondary | ICD-10-CM | POA: Diagnosis not present

## 2016-04-30 ENCOUNTER — Emergency Department (HOSPITAL_COMMUNITY): Payer: Medicare Other

## 2016-04-30 ENCOUNTER — Emergency Department (HOSPITAL_COMMUNITY)
Admission: EM | Admit: 2016-04-30 | Discharge: 2016-04-30 | Disposition: A | Discharge status: Home / Self Care | Payer: Medicare Other | Attending: Emergency Medicine | Admitting: Emergency Medicine

## 2016-04-30 ENCOUNTER — Encounter (HOSPITAL_COMMUNITY): Payer: Self-pay

## 2016-04-30 DIAGNOSIS — R0789 Other chest pain: Secondary | ICD-10-CM | POA: Diagnosis not present

## 2016-04-30 DIAGNOSIS — R0602 Shortness of breath: Secondary | ICD-10-CM | POA: Diagnosis not present

## 2016-04-30 DIAGNOSIS — R079 Chest pain, unspecified: Secondary | ICD-10-CM | POA: Diagnosis not present

## 2016-04-30 DIAGNOSIS — Z7982 Long term (current) use of aspirin: Secondary | ICD-10-CM | POA: Insufficient documentation

## 2016-04-30 DIAGNOSIS — I2510 Atherosclerotic heart disease of native coronary artery without angina pectoris: Secondary | ICD-10-CM | POA: Insufficient documentation

## 2016-04-30 DIAGNOSIS — I10 Essential (primary) hypertension: Secondary | ICD-10-CM | POA: Insufficient documentation

## 2016-04-30 DIAGNOSIS — Z951 Presence of aortocoronary bypass graft: Secondary | ICD-10-CM | POA: Insufficient documentation

## 2016-04-30 DIAGNOSIS — E119 Type 2 diabetes mellitus without complications: Secondary | ICD-10-CM | POA: Insufficient documentation

## 2016-04-30 LAB — I-STAT TROPONIN, ED: Troponin i, poc: 0 ng/mL (ref 0.00–0.08)

## 2016-04-30 LAB — BASIC METABOLIC PANEL
ANION GAP: 10 (ref 5–15)
BUN: 18 mg/dL (ref 6–20)
CO2: 27 mmol/L (ref 22–32)
Calcium: 9.8 mg/dL (ref 8.9–10.3)
Chloride: 101 mmol/L (ref 101–111)
Creatinine, Ser: 1.1 mg/dL (ref 0.61–1.24)
GFR calc Af Amer: >60 mL/min (ref >60)
Glucose, Bld: 109 mg/dL — ABNORMAL HIGH (ref 65–99)
POTASSIUM: 3.9 mmol/L (ref 3.5–5.1)
SODIUM: 138 mmol/L (ref 135–145)

## 2016-04-30 LAB — CBC
HEMATOCRIT: 44.5 % (ref 39.0–52.0)
HEMOGLOBIN: 15 g/dL (ref 13.0–17.0)
MCH: 28.7 pg (ref 26.0–34.0)
MCHC: 33.7 g/dL (ref 30.0–36.0)
MCV: 85.1 fL (ref 78.0–100.0)
Platelets: 207 10*3/uL (ref 150–400)
RBC: 5.23 MIL/uL (ref 4.22–5.81)
RDW: 13 % (ref 11.5–15.5)
WBC: 9 10*3/uL (ref 4.0–10.5)

## 2016-04-30 LAB — TROPONIN I

## 2016-04-30 LAB — BRAIN NATRIURETIC PEPTIDE: B NATRIURETIC PEPTIDE 5: 17.1 pg/mL (ref 0.0–100.0)

## 2016-04-30 MED ORDER — IOPAMIDOL (ISOVUE-370) INJECTION 76%
INTRAVENOUS | Status: AC
Start: 2016-04-30 — End: 2016-04-30
  Administered 2016-04-30: 80 mL
  Filled 2016-04-30: qty 100

## 2016-04-30 NOTE — Discharge Instructions (Signed)
Dr. Erin Hearingroitoru's office should call you in the morning to schedule an appointment in the next few days.  Return here immediately if the chest pain returns.

## 2016-04-30 NOTE — ED Triage Notes (Signed)
Pt reports intermittent shortness of breath X2 days, SOB has worsened today. 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

## 2016-04-30 NOTE — ED Notes (Signed)
Pt returned from CT °

## 2016-04-30 NOTE — ED Provider Notes (Signed)
[facility]-EMERGENCY DEPT Provider Note   CSN: [id number] Arrival date & time: 04/30/16  1235     History   Chief Complaint Chief Complaint  Patient presents with  . Shortness of Breath  . Chest Pain    HPI Adrian [name] is a 65 y.o. male.  Pt has been having intermittent cp and sob over the last 2 days.  The pt said that it wakes him from sleep and he can't go back to sleep until he is sitting up.  The pt said that he had a CABG in 2011 after having a MI and shortly after than he had a pleural effusion and had 800 cc of fluid drawn off of his lungs.  Pt said that he feels like he did then.  Pt denies current cp or sob.  He is followed by Dr. Royann Shiversroitoru, but has not seen him since 2015.      Past Medical History:  Diagnosis Date  . CAD (coronary artery disease)   . Chest wall pain following surgery   . Diabetes mellitus without complication (HCC)   . Hyperlipidemia   . Hypertension   . Pleural effusion, left     Patient Active Problem List   Diagnosis Date Noted  . Mixed hyperlipidemia 03/19/2014  . Benign essential HTN 03/19/2014  . CAD (coronary artery disease)   . Pleural effusion, left   . Chest wall pain following surgery     Past Surgical History:  Procedure Laterality Date  . CARDIAC CATHETERIZATION  04/29/10   LEFT MAIN-FAIRLY SHORT VESSEL, FREE OF DISEASE. LAD 70-90%. A CERTAIN SEGMENT APPEARS SLIGHTLY ULCERATED, THERE MAYBE ACTIVE RUPTURED PLAQUE. RAMUS INTERMEDIUS 95% IN THE PROXIMAL THIRD. LEFT CX 70-80% STENOSIS IN THE MID OM. RCA 40-50% OSTIAL STENOSIS. REFERRED FOR CABG.  . CAROTID DUPLEX Bilateral 04/29/10   NO ICA STENOSIS BIL.+  . CORONARY ARTERY BYPASS GRAFT  04/30/10   X4  . Coronary artery bypass grafting  05/01/2010  . lumbar laminectomy in 2006 by Dr. Pennie BanterKritze    . NUCLEAR STRESS TEST N/A 03/01/10   NORMAL PERFUSION PATTERN IN ALL REGIONS. EF 69%.NL MYOCARDIAL PERFUSION STUDY.  . TRANSTHORACIC ECHOCARDIOGRAM N/A 04/27/10   LV SIZE IS NORMAL. SYSTOLIC  FUNC IS NORMAL.       Home Medications    Prior to Admission medications   Medication Sig Start Date End Date Taking? Authorizing Provider  aspirin 81 MG tablet Take 81 mg by mouth daily.     Yes Historical Provider, MD  atorvastatin (LIPITOR) 20 MG tablet Take 20 mg by mouth daily.     Yes Historical Provider, MD  hydrochlorothiazide (HYDRODIURIL) 12.5 MG tablet Take 12.5 mg by mouth daily.   Yes Historical Provider, MD  ibuprofen (ADVIL,MOTRIN) 200 MG tablet Take 200-800 mg by mouth every 6 (six) hours as needed for headache.   Yes Historical Provider, MD  losartan (COZAAR) 100 MG tablet Take 100 mg by mouth daily.  06/16/11  Yes Historical Provider, MD    Family History History reviewed. No pertinent family history.  Social History Social History  Substance Use Topics  . Smoking status: Never Smoker  . Smokeless tobacco: Never Used  . Alcohol use Yes     Comment: wine      Allergies   Review of patient's allergies indicates no known allergies.   Review of Systems Review of Systems  Respiratory: Positive for shortness of breath.   Cardiovascular: Positive for chest pain.  All other systems reviewed and are negative.  Physical Exam Updated Vital Signs BP 134/73 (BP Location: Right Arm)   Pulse (!) 56   Temp 98.4 F (36.9 C) (Oral)   Resp 16   Ht 5\' 9"  (1.753 m)   Wt 241 lb 11.2 oz (109.6 kg)   SpO2 96%   BMI 35.69 kg/m   Physical Exam  Constitutional: He is oriented to person, place, and time. He appears well-developed and well-nourished.  HENT:  Head: Normocephalic and atraumatic.  Right Ear: External ear normal.  Left Ear: External ear normal.  Nose: Nose normal.  Mouth/Throat: Oropharynx is clear and moist.  Eyes: Conjunctivae and EOM are normal. Pupils are equal, round, and reactive to light.  Neck: Normal range of motion. Neck supple.  Cardiovascular: Normal rate, regular rhythm, normal heart sounds and intact distal pulses.   Pulmonary/Chest:  Effort normal and breath sounds normal.  Abdominal: Soft. Bowel sounds are normal.  Musculoskeletal: Normal range of motion.  Neurological: He is alert and oriented to person, place, and time.  Skin: Skin is warm and dry.  Psychiatric: He has a normal mood and affect. His behavior is normal. Judgment and thought content normal.  Nursing note and vitals reviewed.    ED Treatments / Results  Labs (all labs ordered are listed, but only abnormal results are displayed) Labs Reviewed  BASIC METABOLIC PANEL - Abnormal; Notable for the following:       Result Value   Glucose, Bld 109 (*)    All other components within normal limits  CBC  BRAIN NATRIURETIC PEPTIDE  TROPONIN I  I-STAT TROPOININ, ED    EKG  EKG Interpretation  Date/Time:  Wednesday April 30 2016 12:43:58 EDT Ventricular Rate:  59 PR Interval:  234 QRS Duration: 96 QT Interval:  426 QTC Calculation: 421 R Axis:   39 Text Interpretation:  Sinus bradycardia with sinus arrhythmia with 1st degree A-V block Incomplete right bundle branch block Nonspecific ST and T wave abnormality Abnormal ECG No significant change since last tracing Confirmed by [facility] MD, Lorre Opdahl ([id number]) on 04/30/2016 4:11:40 PM       Radiology Dg Chest 2 View  Result Date: 04/30/2016 CLINICAL DATA:  Intermittent shortness of breath for 2 days. EXAM: CHEST  2 VIEW COMPARISON:  07/02/2011 FINDINGS: Previous median sternotomy and CABG procedure. There is no pleural effusion or edema. Scar like density is noted in the left base. No airspace consolidation or atelectasis identified. IMPRESSION: 1. No acute cardiopulmonary abnormalities. 2. Previous CABG procedure. Electronically Signed   By: Signa Kell M.D.   On: 04/30/2016 14:17   Ct Angio Chest Pe W And/or Wo Contrast  Result Date: 04/30/2016 CLINICAL DATA:  Chest pain and shortness of breath for 3 days. EXAM: CT ANGIOGRAPHY CHEST WITH CONTRAST TECHNIQUE: Multidetector CT imaging of the chest was  performed using the standard protocol during bolus administration of intravenous contrast. Multiplanar CT image reconstructions and MIPs were obtained to evaluate the vascular anatomy. CONTRAST:  80 cc Omnipaque 350 COMPARISON:  Multiple prior chest CTs. The most recent is from 2012. Stable 4 mm right middle lobe pulmonary nodule. No change since 2012. This is considered benign. FINDINGS: Chest wall: No chest wall mass, supraclavicular or axillary lymphadenopathy. The thyroid gland is grossly normal. Cardiovascular: The heart is upper limits of normal in size. No pericardial effusion. Slightly prominent pericardial and epicardial fat. The aorta is normal in caliber. Minimal scattered atherosclerotic calcifications. Surgical changes from coronary artery bypass surgery. Extensive coronary artery calcifications. The pulmonary arterial tree is  fairly well opacified. No filling defects to suggest pulmonary embolism. Mediastinum/Nodes: No mediastinal or hilar mass or adenopathy. Small scattered lymph nodes are normal. The esophagus is normal. Lungs/Pleura: No acute pulmonary findings. No infiltrates, edema or effusions. There is bibasilar dependent subsegmental atelectasis. No worrisome pulmonary nodules. No bronchiectasis or interstitial lung disease. Stable 4 mm right middle lobe pulmonary nodule on image number 61. This is unchanged since 2012 and is considered benign. Upper Abdomen: No significant upper abdominal findings. Musculoskeletal: No significant bony findings. Review of the MIP images confirms the above findings. IMPRESSION: 1. No CT findings for pulmonary embolism. 2. Scattered atherosclerotic calcifications involving the thoracic aorta but no aneurysm or dissection. 3. Advanced three-vessel coronary artery calcifications. Surgical changes from bypass surgery. 4. No acute pulmonary findings. Electronically Signed   By: Rudie Meyer M.D.   On: 04/30/2016 17:09    Procedures Procedures (including critical  care time)  Medications Ordered in ED Medications  iopamidol (ISOVUE-370) 76 % injection (80 mLs  Contrast Given 04/30/16 1650)     Initial Impression / Assessment and Plan / ED Course  I have reviewed the triage vital signs and the nursing notes.  Pertinent labs & imaging results that were available during my care of the patient were reviewed by me and considered in my medical decision making (see chart for details).  Clinical Course  Pt has had 2 sets of negative troponins and an unchanged EKG and no pe.  He is ok for d/c.  Pt has not seen the cardiologist in 2 years, so I d/w Dr. Katrinka Blazing (cardiology).  He will contact the office and have someone from the office call the pt to get an appt in the next few days.  Final Clinical Impressions(s) / ED Diagnoses   Final diagnoses:  Chest pain, unspecified chest pain type    New Prescriptions New Prescriptions   No medications on file     Jacalyn Lefevre, MD 04/30/16 1831

## 2016-04-30 NOTE — ED Notes (Signed)
Pt d/c home with wife

## 2016-05-06 ENCOUNTER — Telehealth: Payer: Self-pay | Admitting: Cardiovascular Disease

## 2016-05-19 ENCOUNTER — Ambulatory Visit: Payer: Medicare Other | Admitting: Cardiovascular Disease

## 2016-06-02 DIAGNOSIS — Z23 Encounter for immunization: Secondary | ICD-10-CM | POA: Diagnosis not present

## 2016-06-02 DIAGNOSIS — G479 Sleep disorder, unspecified: Secondary | ICD-10-CM | POA: Diagnosis not present

## 2016-06-02 DIAGNOSIS — I119 Hypertensive heart disease without heart failure: Secondary | ICD-10-CM | POA: Diagnosis not present

## 2016-06-18 NOTE — Telephone Encounter (Signed)
Closed encounter °

## 2016-07-04 DIAGNOSIS — G4730 Sleep apnea, unspecified: Secondary | ICD-10-CM | POA: Diagnosis not present

## 2016-07-24 DIAGNOSIS — G4733 Obstructive sleep apnea (adult) (pediatric): Secondary | ICD-10-CM | POA: Diagnosis not present

## 2016-08-27 ENCOUNTER — Emergency Department (HOSPITAL_COMMUNITY)
Admission: EM | Admit: 2016-08-27 | Discharge: 2016-08-28 | Disposition: A | Discharge status: Home / Self Care | Payer: Medicare Other | Attending: Emergency Medicine | Admitting: Emergency Medicine

## 2016-08-27 ENCOUNTER — Encounter (HOSPITAL_COMMUNITY): Payer: Self-pay | Admitting: Emergency Medicine

## 2016-08-27 DIAGNOSIS — Z7982 Long term (current) use of aspirin: Secondary | ICD-10-CM | POA: Insufficient documentation

## 2016-08-27 DIAGNOSIS — K625 Hemorrhage of anus and rectum: Secondary | ICD-10-CM | POA: Diagnosis present

## 2016-08-27 DIAGNOSIS — E119 Type 2 diabetes mellitus without complications: Secondary | ICD-10-CM | POA: Diagnosis not present

## 2016-08-27 DIAGNOSIS — K921 Melena: Secondary | ICD-10-CM | POA: Diagnosis not present

## 2016-08-27 DIAGNOSIS — Z79899 Other long term (current) drug therapy: Secondary | ICD-10-CM | POA: Diagnosis not present

## 2016-08-27 DIAGNOSIS — R140 Abdominal distension (gaseous): Secondary | ICD-10-CM | POA: Diagnosis not present

## 2016-08-27 DIAGNOSIS — K529 Noninfective gastroenteritis and colitis, unspecified: Secondary | ICD-10-CM | POA: Insufficient documentation

## 2016-08-27 DIAGNOSIS — I10 Essential (primary) hypertension: Secondary | ICD-10-CM | POA: Diagnosis not present

## 2016-08-27 DIAGNOSIS — I2510 Atherosclerotic heart disease of native coronary artery without angina pectoris: Secondary | ICD-10-CM | POA: Diagnosis not present

## 2016-08-27 DIAGNOSIS — K6389 Other specified diseases of intestine: Secondary | ICD-10-CM | POA: Diagnosis not present

## 2016-08-27 DIAGNOSIS — Z951 Presence of aortocoronary bypass graft: Secondary | ICD-10-CM | POA: Diagnosis not present

## 2016-08-27 DIAGNOSIS — K2970 Gastritis, unspecified, without bleeding: Secondary | ICD-10-CM | POA: Diagnosis not present

## 2016-08-27 LAB — COMPREHENSIVE METABOLIC PANEL
ALBUMIN: 4.2 g/dL (ref 3.5–5.0)
ALT: 25 U/L (ref 17–63)
AST: 21 U/L (ref 15–41)
Alkaline Phosphatase: 56 U/L (ref 38–126)
Anion gap: 8 (ref 5–15)
BUN: 22 mg/dL — ABNORMAL HIGH (ref 6–20)
CHLORIDE: 103 mmol/L (ref 101–111)
CO2: 26 mmol/L (ref 22–32)
CREATININE: 1.03 mg/dL (ref 0.61–1.24)
Calcium: 9.1 mg/dL (ref 8.9–10.3)
Glucose, Bld: 126 mg/dL — ABNORMAL HIGH (ref 65–99)
POTASSIUM: 3.4 mmol/L — AB (ref 3.5–5.1)
SODIUM: 137 mmol/L (ref 135–145)
TOTAL PROTEIN: 7.5 g/dL (ref 6.5–8.1)
Total Bilirubin: 0.9 mg/dL (ref 0.3–1.2)

## 2016-08-27 LAB — CBC
HEMATOCRIT: 42.3 % (ref 39.0–52.0)
Hemoglobin: 14.4 g/dL (ref 13.0–17.0)
MCH: 28.3 pg (ref 26.0–34.0)
MCHC: 34 g/dL (ref 30.0–36.0)
MCV: 83.3 fL (ref 78.0–100.0)
PLATELETS: 198 10*3/uL (ref 150–400)
RBC: 5.08 MIL/uL (ref 4.22–5.81)
RDW: 12.9 % (ref 11.5–15.5)
WBC: 14.4 10*3/uL — AB (ref 4.0–10.5)

## 2016-08-27 LAB — POC OCCULT BLOOD, ED: FECAL OCCULT BLD: POSITIVE — AB

## 2016-08-27 NOTE — ED Provider Notes (Signed)
[facility]-EMERGENCY DEPT Provider Note   CSN: [id number] Arrival date & time: 08/27/16  2259   By signing my name below, I, Nelwyn SalisburyJoshua Fowler, attest that this documentation has been prepared under the direction and in the presence of Tilden FossaElizabeth Makayla Confer, MD . Electronically Signed: Nelwyn SalisburyJoshua Fowler, Scribe. 08/27/2016. 11:11 PM.  History   Chief Complaint Chief Complaint  Patient presents with  . Rectal Bleeding   The history is provided by the patient. No language interpreter was used.    HPI Comments:  Driscilla GrammesSteve A Lamarque is a 66 y.o. male with pmhx of DM, MI, and CAD who presents to the Emergency Department complaining of sudden-onset, intermittent rectal bleeding beginning about 10 hours ago. Pt states that he felt a sensation this morning that he had to have a Bm. He states he was initially unable to void, but when he did he noticed it was bloody. He states that this has happened 5 times today. Pt reports associated diarrhea and waxing/waning, LLQ, abdominal pain. He has not tried anything at home for his symptoms. Pt denies any fevers, nausea, vomiting, SOB or dizziness. He is compliant with a daily aspirin.  Past Medical History:  Diagnosis Date  . CAD (coronary artery disease)   . Chest wall pain following surgery   . Diabetes mellitus without complication (HCC)   . Hyperlipidemia   . Hypertension   . Pleural effusion, left     Patient Active Problem List   Diagnosis Date Noted  . Mixed hyperlipidemia 03/19/2014  . Benign essential HTN 03/19/2014  . CAD (coronary artery disease)   . Pleural effusion, left   . Chest wall pain following surgery     Past Surgical History:  Procedure Laterality Date  . CARDIAC CATHETERIZATION  04/29/10   LEFT MAIN-FAIRLY SHORT VESSEL, FREE OF DISEASE. LAD 70-90%. A CERTAIN SEGMENT APPEARS SLIGHTLY ULCERATED, THERE MAYBE ACTIVE RUPTURED PLAQUE. RAMUS INTERMEDIUS 95% IN THE PROXIMAL THIRD. LEFT CX 70-80% STENOSIS IN THE MID OM. RCA 40-50% OSTIAL STENOSIS.  REFERRED FOR CABG.  . CAROTID DUPLEX Bilateral 04/29/10   NO ICA STENOSIS BIL.+  . CORONARY ARTERY BYPASS GRAFT  04/30/10   X4  . Coronary artery bypass grafting  05/01/2010  . lumbar laminectomy in 2006 by Dr. Pennie BanterKritze    . NUCLEAR STRESS TEST N/A 03/01/10   NORMAL PERFUSION PATTERN IN ALL REGIONS. EF 69%.NL MYOCARDIAL PERFUSION STUDY.  . TRANSTHORACIC ECHOCARDIOGRAM N/A 04/27/10   LV SIZE IS NORMAL. SYSTOLIC FUNC IS NORMAL.       Home Medications    Prior to Admission medications   Medication Sig Start Date End Date Taking? Authorizing Provider  aspirin 81 MG tablet Take 81 mg by mouth daily.     Yes Historical Provider, MD  atorvastatin (LIPITOR) 20 MG tablet Take 20 mg by mouth daily.     Yes Historical Provider, MD  hydrochlorothiazide (HYDRODIURIL) 12.5 MG tablet Take 12.5 mg by mouth daily.   Yes Historical Provider, MD  ibuprofen (ADVIL,MOTRIN) 200 MG tablet Take 200-800 mg by mouth every 6 (six) hours as needed for headache.   Yes Historical Provider, MD  losartan (COZAAR) 100 MG tablet Take 100 mg by mouth daily.  06/16/11  Yes Historical Provider, MD  ciprofloxacin (CIPRO) 500 MG tablet Take 1 tablet (500 mg total) by mouth 2 (two) times daily. 08/28/16   Tilden FossaElizabeth Tarae Wooden, MD  metroNIDAZOLE (FLAGYL) 500 MG tablet Take 1 tablet (500 mg total) by mouth 3 (three) times daily. 08/28/16   Tilden FossaElizabeth Elin Seats, MD  Family History No family history on file.  Social History Social History  Substance Use Topics  . Smoking status: Never Smoker  . Smokeless tobacco: Never Used  . Alcohol use Yes     Comment: 3x a week     Allergies   Patient has no known allergies.   Review of Systems Review of Systems  Constitutional: Negative for fever.  Respiratory: Negative for shortness of breath.   Gastrointestinal: Positive for abdominal pain, anal bleeding, blood in stool, diarrhea and hematochezia. Negative for nausea and vomiting.  Neurological: Negative for dizziness.  All other  systems reviewed and are negative.    Physical Exam Updated Vital Signs BP 151/68   Pulse (!) 53   Temp 97.6 F (36.4 C) (Oral)   Resp 18   SpO2 94%   Physical Exam  Constitutional: He is oriented to person, place, and time. He appears well-developed and well-nourished.  HENT:  Head: Normocephalic and atraumatic.  Cardiovascular: Normal rate and regular rhythm.   No murmur heard. Pulmonary/Chest: Effort normal and breath sounds normal. No respiratory distress.  Abdominal: Soft. There is tenderness. There is no rebound and no guarding.  Moderate LLQ tenderness  Genitourinary:  Genitourinary Comments: Non-thrombosed, non-bloody external hemorrhoids. Empty rectal vault.  Musculoskeletal: He exhibits no edema or tenderness.  Neurological: He is alert and oriented to person, place, and time.  Skin: Skin is warm and dry.  Psychiatric: He has a normal mood and affect. His behavior is normal.  Nursing note and vitals reviewed.    ED Treatments / Results  DIAGNOSTIC STUDIES:  Oxygen Saturation is 96% on RA, normal by my interpretation.    COORDINATION OF CARE:  11:20 PM Discussed treatment plan with pt at bedside which includes rectal exam and guiac test and pt agreed to plan.  12:26 AM Updated pt on lab results    Labs (all labs ordered are listed, but only abnormal results are displayed) Labs Reviewed  COMPREHENSIVE METABOLIC PANEL - Abnormal; Notable for the following:       Result Value   Potassium 3.4 (*)    Glucose, Bld 126 (*)    BUN 22 (*)    All other components within normal limits  CBC - Abnormal; Notable for the following:    WBC 14.4 (*)    All other components within normal limits  POC OCCULT BLOOD, ED - Abnormal; Notable for the following:    Fecal Occult Bld POSITIVE (*)    All other components within normal limits  TYPE AND SCREEN  ABO/RH    EKG  EKG Interpretation None       Radiology Ct Abdomen Pelvis W Contrast  Result Date:  08/28/2016 CLINICAL DATA:  Acute onset of intermittent rectal bleeding and left lower quadrant abdominal pain. Diarrhea. Leukocytosis. Initial encounter. EXAM: CT ABDOMEN AND PELVIS WITH CONTRAST TECHNIQUE: Multidetector CT imaging of the abdomen and pelvis was performed using the standard protocol following bolus administration of intravenous contrast. CONTRAST:  ISOVUE-300 IOPAMIDOL (ISOVUE-300) INJECTION 61% COMPARISON:  CT of the lumbar spine performed 12/29/2005 FINDINGS: Lower chest: Scattered coronary artery calcifications are seen. The patient is status post median sternotomy. Patchy atelectasis is noted at the lung bases. Hepatobiliary: The liver is unremarkable in appearance. The gallbladder is unremarkable in appearance. The common bile duct remains normal in caliber. Pancreas: The pancreas is within normal limits. Spleen: The spleen is enlarged, measuring 13.9 cm in length. Adrenals/Urinary Tract: The adrenal glands are unremarkable in appearance. 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 The kidneys are otherwise within normal limits. There is no evidence of hydronephrosis. No renal or ureteral stones are identified. No perinephric stranding is seen. Stomach/Bowel: There is segmental wall thickening involving the descending and sigmoid colon, compatible with acute infectious or inflammatory colitis. Mild associated soft tissue inflammation is seen, without evidence of perforation or abscess formation. The remainder of the colon is unremarkable in appearance. The stomach is unremarkable in appearance. The small bowel is within normal limits. The appendix is not visualized; there is no evidence for appendicitis. Vascular/Lymphatic: Scattered calcification is seen along the abdominal aorta and its branches. The abdominal aorta is otherwise grossly unremarkable. The inferior vena cava is grossly unremarkable. No retroperitoneal lymphadenopathy is seen. No pelvic sidewall lymphadenopathy is  identified. Reproductive: The bladder is mildly distended and grossly unremarkable. The prostate remains normal in size. Other: No additional soft tissue abnormalities are seen. Musculoskeletal: No acute osseous abnormalities are identified. Vacuum phenomenon is noted at L4-L5. The visualized musculature is unremarkable in appearance. IMPRESSION: 1. Segmental wall thickening and soft tissue inflammation along the descending and sigmoid colon, compatible with acute infectious or inflammatory colitis. No evidence of perforation or abscess formation at this time. 2. Splenomegaly. 3. Scattered coronary artery calcifications seen. 4. Bibasilar atelectasis noted. 5. Scattered aortic atherosclerosis. Electronically Signed   By: Roanna Raider M.D.   On: 08/28/2016 01:38    Procedures Procedures (including critical care time)  Medications Ordered in ED Medications  iopamidol (ISOVUE-300) 61 % injection 100 mL (100 mLs Intravenous Contrast Given 08/28/16 0118)  ciprofloxacin (CIPRO) tablet 500 mg (500 mg Oral Given 08/28/16 0214)  metroNIDAZOLE (FLAGYL) tablet 500 mg (500 mg Oral Given 08/28/16 0214)     Initial Impression / Assessment and Plan / ED Course  I have reviewed the triage vital signs and the nursing notes.  Pertinent labs & imaging results that were available during my care of the patient were reviewed by me and considered in my medical decision making (see chart for details).  Clinical Course     Patient here for evaluation of hematochezia, lower abdominal pain. Patient declines any pain medications in the emergency department. No evidence of massive hemorrhage on examination. Hemoglobin is improved when compared to priors. CBC with mild leukocytosis. CT abdomen with evidence of colitis, no complicating features on imaging. Given the symptoms, tenderness, leukocytosis will treat for possible infectious etiology with antibiotics. He has not been on antibiotics recently. Consultation on home  care for colitis as well as hematochezia. Discussed GI follow-up, return precautions.  Final Clinical Impressions(s) / ED Diagnoses   Final diagnoses:  Colitis  Hematochezia    New Prescriptions Discharge Medication List as of 08/28/2016  2:05 AM    START taking these medications   Details  ciprofloxacin (CIPRO) 500 MG tablet Take 1 tablet (500 mg total) by mouth 2 (two) times daily., Starting Thu 08/28/2016, Print    metroNIDAZOLE (FLAGYL) 500 MG tablet Take 1 tablet (500 mg total) by mouth 3 (three) times daily., Starting Thu 08/28/2016, Print      I personally performed the services described in this documentation, which was scribed in my presence. The recorded information has been reviewed and is accurate.     Tilden Fossa, MD 08/28/16 [phone]

## 2016-08-27 NOTE — ED Triage Notes (Signed)
Pt brought in by EMS with c/o rectal bleeding  Pt had a sudden onset of diarrhea this morning around 10am accompanied with abd pain  Pt states around 1330 he went to the bathroom and passed some bright red blood from his rectum  Pt states that has happened a couple of times since then today  Pt is c/o abd pain and rectal discomfort with bowel movements  Pt denies weakness, dizziness, or near syncopal episodes today

## 2016-08-28 ENCOUNTER — Emergency Department (HOSPITAL_COMMUNITY): Payer: Medicare Other

## 2016-08-28 ENCOUNTER — Encounter (HOSPITAL_COMMUNITY): Payer: Self-pay

## 2016-08-28 DIAGNOSIS — K6389 Other specified diseases of intestine: Secondary | ICD-10-CM | POA: Diagnosis not present

## 2016-08-28 LAB — TYPE AND SCREEN
ABO/RH(D): O NEG
ANTIBODY SCREEN: NEGATIVE

## 2016-08-28 LAB — ABO/RH: ABO/RH(D): O NEG

## 2016-08-28 MED ORDER — CIPROFLOXACIN HCL 500 MG PO TABS: 500.0000 mg | ORAL_TABLET | Freq: Two times a day (BID) | ORAL | 0 refills | Status: DC

## 2016-08-28 MED ORDER — IOPAMIDOL (ISOVUE-300) INJECTION 61%
100.0000 mL | Freq: Once | INTRAVENOUS | Status: AC | PRN
  Administered 2016-08-28: 100 mL via INTRAVENOUS

## 2016-08-28 MED ORDER — CIPROFLOXACIN HCL 500 MG PO TABS
500.0000 mg | ORAL_TABLET | Freq: Once | ORAL | Status: AC
  Administered 2016-08-28: 500 mg via ORAL
  Filled 2016-08-28: qty 1

## 2016-08-28 MED ORDER — IOPAMIDOL (ISOVUE-300) INJECTION 61%
INTRAVENOUS | Status: AC
  Filled 2016-08-28: qty 100

## 2016-08-28 MED ORDER — METRONIDAZOLE 500 MG PO TABS
500.0000 mg | ORAL_TABLET | Freq: Once | ORAL | Status: AC
  Administered 2016-08-28: 500 mg via ORAL
  Filled 2016-08-28: qty 1

## 2016-08-28 MED ORDER — METRONIDAZOLE 500 MG PO TABS: 500.0000 mg | ORAL_TABLET | Freq: Three times a day (TID) | ORAL | 0 refills | Status: DC

## 2016-08-28 NOTE — ED Notes (Signed)
MD at bedside. 

## 2016-09-01 DIAGNOSIS — K625 Hemorrhage of anus and rectum: Secondary | ICD-10-CM | POA: Diagnosis not present

## 2016-09-01 DIAGNOSIS — R933 Abnormal findings on diagnostic imaging of other parts of digestive tract: Secondary | ICD-10-CM | POA: Diagnosis not present

## 2016-09-01 DIAGNOSIS — D72829 Elevated white blood cell count, unspecified: Secondary | ICD-10-CM | POA: Diagnosis not present

## 2016-10-03 DIAGNOSIS — K625 Hemorrhage of anus and rectum: Secondary | ICD-10-CM | POA: Diagnosis not present

## 2016-10-03 DIAGNOSIS — I119 Hypertensive heart disease without heart failure: Secondary | ICD-10-CM | POA: Diagnosis not present

## 2016-10-03 DIAGNOSIS — I2510 Atherosclerotic heart disease of native coronary artery without angina pectoris: Secondary | ICD-10-CM | POA: Diagnosis not present

## 2016-10-07 DIAGNOSIS — D126 Benign neoplasm of colon, unspecified: Secondary | ICD-10-CM | POA: Diagnosis not present

## 2016-10-07 DIAGNOSIS — K625 Hemorrhage of anus and rectum: Secondary | ICD-10-CM | POA: Diagnosis not present

## 2016-10-07 DIAGNOSIS — K5730 Diverticulosis of large intestine without perforation or abscess without bleeding: Secondary | ICD-10-CM | POA: Diagnosis not present

## 2016-10-09 DIAGNOSIS — D126 Benign neoplasm of colon, unspecified: Secondary | ICD-10-CM | POA: Diagnosis not present

## 2016-10-22 ENCOUNTER — Emergency Department (HOSPITAL_BASED_OUTPATIENT_CLINIC_OR_DEPARTMENT_OTHER): Admit: 2016-10-22 | Discharge: 2016-10-22 | Disposition: A | Discharge status: Home / Self Care | Payer: Medicare Other

## 2016-10-22 ENCOUNTER — Emergency Department (HOSPITAL_COMMUNITY): Payer: Medicare Other

## 2016-10-22 ENCOUNTER — Emergency Department (HOSPITAL_COMMUNITY)
Admission: EM | Admit: 2016-10-22 | Discharge: 2016-10-22 | Disposition: A | Discharge status: Home / Self Care | Payer: Medicare Other | Attending: Emergency Medicine | Admitting: Emergency Medicine

## 2016-10-22 ENCOUNTER — Encounter (HOSPITAL_COMMUNITY): Payer: Self-pay

## 2016-10-22 DIAGNOSIS — I2510 Atherosclerotic heart disease of native coronary artery without angina pectoris: Secondary | ICD-10-CM | POA: Insufficient documentation

## 2016-10-22 DIAGNOSIS — M79605 Pain in left leg: Secondary | ICD-10-CM | POA: Diagnosis not present

## 2016-10-22 DIAGNOSIS — M79609 Pain in unspecified limb: Secondary | ICD-10-CM | POA: Diagnosis not present

## 2016-10-22 DIAGNOSIS — Z951 Presence of aortocoronary bypass graft: Secondary | ICD-10-CM | POA: Diagnosis not present

## 2016-10-22 DIAGNOSIS — I10 Essential (primary) hypertension: Secondary | ICD-10-CM | POA: Diagnosis not present

## 2016-10-22 DIAGNOSIS — M79652 Pain in left thigh: Secondary | ICD-10-CM | POA: Diagnosis not present

## 2016-10-22 DIAGNOSIS — Z79899 Other long term (current) drug therapy: Secondary | ICD-10-CM | POA: Diagnosis not present

## 2016-10-22 DIAGNOSIS — Z7982 Long term (current) use of aspirin: Secondary | ICD-10-CM | POA: Insufficient documentation

## 2016-10-22 DIAGNOSIS — Z955 Presence of coronary angioplasty implant and graft: Secondary | ICD-10-CM | POA: Diagnosis not present

## 2016-10-22 DIAGNOSIS — I700 Atherosclerosis of aorta: Secondary | ICD-10-CM | POA: Diagnosis not present

## 2016-10-22 DIAGNOSIS — E119 Type 2 diabetes mellitus without complications: Secondary | ICD-10-CM | POA: Diagnosis not present

## 2016-10-22 LAB — BASIC METABOLIC PANEL
Anion gap: 7 (ref 5–15)
BUN: 19 mg/dL (ref 6–20)
CHLORIDE: 106 mmol/L (ref 101–111)
CO2: 26 mmol/L (ref 22–32)
CREATININE: 1.09 mg/dL (ref 0.61–1.24)
Calcium: 9.1 mg/dL (ref 8.9–10.3)
GFR calc non Af Amer: >60 mL/min (ref >60)
Glucose, Bld: 113 mg/dL — ABNORMAL HIGH (ref 65–99)
POTASSIUM: 3.8 mmol/L (ref 3.5–5.1)
SODIUM: 139 mmol/L (ref 135–145)

## 2016-10-22 LAB — CBC WITH DIFFERENTIAL/PLATELET
BASOS PCT: 0 %
Basophils Absolute: 0 10*3/uL (ref 0.0–0.1)
EOS ABS: 0.1 10*3/uL (ref 0.0–0.7)
Eosinophils Relative: 1 %
HEMATOCRIT: 40.5 % (ref 39.0–52.0)
HEMOGLOBIN: 13.9 g/dL (ref 13.0–17.0)
LYMPHS ABS: 1.8 10*3/uL (ref 0.7–4.0)
Lymphocytes Relative: 21 %
MCH: 28.3 pg (ref 26.0–34.0)
MCHC: 34.3 g/dL (ref 30.0–36.0)
MCV: 82.5 fL (ref 78.0–100.0)
MONOS PCT: 6 %
Monocytes Absolute: 0.5 10*3/uL (ref 0.1–1.0)
NEUTROS ABS: 6.2 10*3/uL (ref 1.7–7.7)
NEUTROS PCT: 72 %
Platelets: 187 10*3/uL (ref 150–400)
RBC: 4.91 MIL/uL (ref 4.22–5.81)
RDW: 13 % (ref 11.5–15.5)
WBC: 8.6 10*3/uL (ref 4.0–10.5)

## 2016-10-22 LAB — CK: CK TOTAL: 192 U/L (ref 49–397)

## 2016-10-22 MED ORDER — DIAZEPAM 5 MG/ML IJ SOLN
5.0000 mg | Freq: Once | INTRAMUSCULAR | Status: DC
  Filled 2016-10-22: qty 2

## 2016-10-22 MED ORDER — HYDROMORPHONE HCL 1 MG/ML IJ SOLN
0.5000 mg | Freq: Once | INTRAMUSCULAR | Status: AC
  Administered 2016-10-22: 0.5 mg via INTRAVENOUS
  Filled 2016-10-22: qty 0.5

## 2016-10-22 MED ORDER — MORPHINE SULFATE (PF) 4 MG/ML IV SOLN
4.0000 mg | Freq: Once | INTRAVENOUS | Status: DC
  Filled 2016-10-22: qty 1

## 2016-10-22 MED ORDER — ONDANSETRON HCL 4 MG/2ML IJ SOLN
4.0000 mg | Freq: Once | INTRAMUSCULAR | Status: AC
  Administered 2016-10-22: 4 mg via INTRAVENOUS
  Filled 2016-10-22: qty 2

## 2016-10-22 MED ORDER — IOPAMIDOL (ISOVUE-370) INJECTION 76%
INTRAVENOUS | Status: AC
  Administered 2016-10-22: 100 mL
  Filled 2016-10-22: qty 100

## 2016-10-22 MED ORDER — PREDNISONE 20 MG PO TABS: ORAL_TABLET | ORAL | 0 refills | Status: DC

## 2016-10-22 MED ORDER — METHOCARBAMOL 500 MG PO TABS: 1000.0000 mg | ORAL_TABLET | Freq: Four times a day (QID) | ORAL | 0 refills | Status: DC

## 2016-10-22 MED ORDER — OXYCODONE-ACETAMINOPHEN 5-325 MG PO TABS: 1.0000 | ORAL_TABLET | Freq: Four times a day (QID) | ORAL | 0 refills | Status: DC | PRN

## 2016-10-22 NOTE — ED Notes (Signed)
Unable to collect labs at this time patient is having an ultrasound done

## 2016-10-22 NOTE — Progress Notes (Signed)
**  Preliminary report by tech**  Left lower extremity venous duplex complete. There is no evidence of deep or superficial vein thrombosis involving the left lower extremity. All visualized vessels appear patent and compressible. There is no evidence of a Baker's cyst on the left. Results were given to Dr. Chaney Mallingavid Yao.  10/22/16 12:38 PM Olen CordialGreg Aarron Wierzbicki RVT

## 2016-10-22 NOTE — ED Notes (Signed)
ED Provider at bedside. 

## 2016-10-22 NOTE — ED Notes (Signed)
Pt states that left foot & knee feel "weird and tingly" and that the pain is radiating upward and downward.  Pt states that he cannot walk on that leg and that the pain started suddenly yesterday morning.

## 2016-10-22 NOTE — Discharge Instructions (Signed)
Please read and follow all provided instructions.  Your diagnoses today include:  1. Left leg pain    Tests performed today include:  Vital signs - see below for your results today  Blood counts and electrolytes  CT scan of your blood vessels, abdomen, and leg  Medications prescribed:   Prednisone - steroid medicine   It is best to take this medication in the morning to prevent sleeping problems. If you are diabetic, monitor your blood sugar closely and stop taking Prednisone if blood sugar is over 300. Take with food to prevent stomach upset.    Percocet (oxycodone/acetaminophen) - narcotic pain medication  DO NOT drive or perform any activities that require you to be awake and alert because this medicine can make you drowsy. BE VERY CAREFUL not to take multiple medicines containing Tylenol (also called acetaminophen). Doing so can lead to an overdose which can damage your liver and cause liver failure and possibly death.   Robaxin (methocarbamol) - muscle relaxer medication  DO NOT drive or perform any activities that require you to be awake and alert because this medicine can make you drowsy.   Take any prescribed medications only as directed.  Home care instructions:   Follow any educational materials contained in this packet  Please rest, use ice or heat on your back for the next several days  Do not lift, push, pull anything more than 10 pounds for the next week  Follow-up instructions: Please follow-up with your primary care provider in the next 1 week for further evaluation of your symptoms.   Return instructions:  SEEK IMMEDIATE MEDICAL ATTENTION IF YOU HAVE:  New numbness, tingling, weakness, or problem with the use of your arms or legs  Severe back pain not relieved with medications  Loss control of your bowels or bladder  Increasing pain in any areas of the body (such as chest or abdominal pain)  Shortness of breath, dizziness, or fainting.    Worsening nausea (feeling sick to your stomach), vomiting, fever, or sweats  Any other emergent concerns regarding your health   Additional Information:  Your vital signs today were: BP 132/81    Pulse (!) 53    Temp 97.5 F (36.4 C) (Oral)    Resp 18    SpO2 98%  If your blood pressure (BP) was elevated above 135/85 this visit, please have this repeated by your doctor within one month. --------------

## 2016-10-22 NOTE — ED Notes (Signed)
Patient was alert, oriented and stable upon discharge. RN went over AVS and patient had no further questions.  

## 2016-10-22 NOTE — ED Triage Notes (Signed)
Pt woke up yesterday with left thigh pain.  No injury.

## 2016-10-22 NOTE — ED Provider Notes (Signed)
[facility]-EMERGENCY DEPT Provider Note   CSN: [id number] Arrival date & time: 10/22/16  0944     History   Chief Complaint Chief Complaint  Patient presents with  . Leg Pain    HPI Adrian [name] is a 66 y.o. male.  Patient with history of diabetes, cardiac bypass, remote history of back injections -- presents with complaint of severe pain to the left thigh starting yesterday when he got out of bed. Patient reports sharp pains and also burning pains at times. He feels 'a knot' over the lateral left thigh region, however pain is not localized to this area. Pain is around the thigh and goes down towards the left foot. He is able to "limp around" but states his leg gives out suddenly. No recent history of back problems. States he had injection in his back a long time ago. Patient denies warning symptoms of back pain including: fecal incontinence, urinary retention or overflow incontinence, night sweats, waking from sleep with back pain, unexplained fevers or weight loss, h/o cancer, IVDU, recent trauma. No h/o afib or blood clots. No anticoagulation. Over-the-counter medications taken without relief. No color change. Patient went to medical clinic today and was told to come to the emergency department to check for blood clot. The onset of this condition was acute. The course is constant. Aggravating factors: movement, palpation, walking. Alleviating factors: none.   Aortic atherosclerosis noted on CT 08/2016.           Past Medical History:  Diagnosis Date  . CAD (coronary artery disease)   . Chest wall pain following surgery   . Diabetes mellitus without complication (HCC)   . Hyperlipidemia   . Hypertension   . Pleural effusion, left     Patient Active Problem List   Diagnosis Date Noted  . Mixed hyperlipidemia 03/19/2014  . Benign essential HTN 03/19/2014  . CAD (coronary artery disease)   . Pleural effusion, left   . Chest wall pain following surgery     Past Surgical  History:  Procedure Laterality Date  . CARDIAC CATHETERIZATION  04/29/10   LEFT MAIN-FAIRLY SHORT VESSEL, FREE OF DISEASE. LAD 70-90%. A CERTAIN SEGMENT APPEARS SLIGHTLY ULCERATED, THERE MAYBE ACTIVE RUPTURED PLAQUE. RAMUS INTERMEDIUS 95% IN THE PROXIMAL THIRD. LEFT CX 70-80% STENOSIS IN THE MID OM. RCA 40-50% OSTIAL STENOSIS. REFERRED FOR CABG.  . CAROTID DUPLEX Bilateral 04/29/10   NO ICA STENOSIS BIL.+  . CORONARY ARTERY BYPASS GRAFT  04/30/10   X4  . Coronary artery bypass grafting  05/01/2010  . lumbar laminectomy in 2006 by Dr. Pennie Banter    . NUCLEAR STRESS TEST N/A 03/01/10   NORMAL PERFUSION PATTERN IN ALL REGIONS. EF 69%.NL MYOCARDIAL PERFUSION STUDY.  . TRANSTHORACIC ECHOCARDIOGRAM N/A 04/27/10   LV SIZE IS NORMAL. SYSTOLIC FUNC IS NORMAL.       Home Medications    Prior to Admission medications   Medication Sig Start Date End Date Taking? Authorizing Provider  acetaminophen (TYLENOL) 500 MG tablet Take 1,000 mg by mouth every 6 (six) hours as needed for mild pain or moderate pain.   Yes Historical Provider, MD  aspirin 81 MG tablet Take 81 mg by mouth every morning.    Yes Historical Provider, MD  atorvastatin (LIPITOR) 20 MG tablet Take 20 mg by mouth every morning.    Yes Historical Provider, MD  hydrochlorothiazide (HYDRODIURIL) 12.5 MG tablet Take 12.5 mg by mouth every morning.    Yes Historical Provider, MD  ibuprofen (ADVIL,MOTRIN) 200 MG tablet  Take 200-800 mg by mouth every 6 (six) hours as needed for headache.   Yes Historical Provider, MD  losartan (COZAAR) 100 MG tablet Take 100 mg by mouth every morning.  06/16/11  Yes Historical Provider, MD  ciprofloxacin (CIPRO) 500 MG tablet Take 1 tablet (500 mg total) by mouth 2 (two) times daily. Patient not taking: Reported on 10/22/2016 08/28/16   Tilden Fossa, MD  metroNIDAZOLE (FLAGYL) 500 MG tablet Take 1 tablet (500 mg total) by mouth 3 (three) times daily. Patient not taking: Reported on 10/22/2016 08/28/16   Tilden Fossa,  MD    Family History History reviewed. No pertinent family history.  Social History Social History  Substance Use Topics  . Smoking status: Never Smoker  . Smokeless tobacco: Never Used  . Alcohol use Yes     Comment: 3x a week     Allergies   Patient has no known allergies.   Review of Systems Review of Systems  Constitutional: Negative for fever and unexpected weight change.  HENT: Negative for rhinorrhea and sore throat.   Eyes: Negative for redness.  Respiratory: Negative for cough.   Cardiovascular: Negative for chest pain.  Gastrointestinal: Negative for abdominal pain, constipation, diarrhea, nausea and vomiting.       Neg for fecal incontinence  Genitourinary: Negative for difficulty urinating, dysuria, flank pain and hematuria.       Negative for urinary incontinence or retention  Musculoskeletal: Positive for myalgias. Negative for back pain.  Skin: Negative for rash.  Neurological: Negative for weakness, numbness and headaches.       Negative for saddle paresthesias      Physical Exam Updated Vital Signs BP 139/76 (BP Location: Right Arm)   Pulse 65   Temp 97.5 F (36.4 C) (Oral)   Resp 18   SpO2 95%   Physical Exam  Constitutional: He appears well-developed and well-nourished.  HENT:  Head: Normocephalic and atraumatic.  Eyes: Conjunctivae are normal.  Neck: Normal range of motion.  Cardiovascular:  Pulses:      Dorsalis pedis pulses are 2+ on the right side, and 2+ on the left side.  Abdominal: Soft. There is no tenderness. There is no CVA tenderness.  Musculoskeletal: Normal range of motion.       Right hip: Normal.       Left hip: He exhibits normal range of motion, normal strength and no tenderness.       Left knee: Normal.       Cervical back: Normal.       Thoracic back: Normal.       Lumbar back: Normal.       Left upper leg: He exhibits tenderness (lateral ). He exhibits no bony tenderness, no swelling and no edema.       Left lower  leg: Normal.       Left foot: Normal.  No step-off noted with palpation of spine.   Neurological: He is alert. He has normal reflexes. No sensory deficit. He exhibits normal muscle tone.  5/5 strength in entire lower extremities bilaterally. No sensation deficit.   Skin: Skin is warm and dry.  Psychiatric: He has a normal mood and affect.  Nursing note and vitals reviewed.    ED Treatments / Results  Labs (all labs ordered are listed, but only abnormal results are displayed) Labs Reviewed  BASIC METABOLIC PANEL - Abnormal; Notable for the following:       Result Value   Glucose, Bld 113 (*)  All other components within normal limits  CBC WITH DIFFERENTIAL/PLATELET  CK    EKG  EKG Interpretation None       Radiology Ct Angio Aortobifemoral W And/or Wo Contrast  Result Date: 10/22/2016 CLINICAL DATA:  66 year old male with a history of left thigh pain on unable to bear weight EXAM: CT ANGIOGRAPHY OF ABDOMINAL AORTA WITH ILIOFEMORAL RUNOFF TECHNIQUE: Multidetector CT imaging of the abdomen, pelvis and lower extremities was performed using the standard protocol during bolus administration of intravenous contrast. Multiplanar CT image reconstructions and MIPs were obtained to evaluate the vascular anatomy. CONTRAST:  100 cc Isovue 370 COMPARISON:  CT 08/28/2016 FINDINGS: VASCULAR Aorta: Unremarkable course, caliber, contour of the abdominal aorta. No dissection, aneurysm, or periaortic fluid. Minimal calcified atherosclerotic changes. Celiac: No significant atherosclerotic changes at the origin of the celiac artery. Typical branch pattern of the celiac artery, with left gastric, common hepatic, and splenic artery identified. SMA: Soft plaque at the origin of the superior mesenteric artery with perhaps 50% narrowing. Distal branches are patent. No inflammatory changes. Renals: Renal arteries are patent. Single left and single right renal artery. IMA: Inferior mesenteric artery is patent.  Left colic artery patent. Superior rectal artery patent. Right lower extremity: Unremarkable course, caliber, and contour of the right iliac system. No aneurysm, dissection, or occlusion. Hypogastric artery is patent. Anterior and posterior division patent. Common femoral artery patent. Proximal SFA and profunda femoris patent. Popliteal artery patent. Trifurcation patent with proximal tibial vessels patent. The distal tibial vessels are poorly opacified, likely secondary to late arrival of the contrast bolus. Left lower extremity: Unremarkable course, caliber, and contour of the left iliac system. No aneurysm, dissection, or occlusion. Hypogastric artery is patent. Anterior and posterior division patent. Common femoral artery patent. Proximal SFA and profunda femoris patent. Popliteal artery patent. Trifurcation is patent. Proximal tibial vessels patent. Distal tibial vessels are poorly opacified, likely secondary to late arrival of the contrast bolus. Veins: Unremarkable appearance of the venous system. Review of the MIP images confirms the above findings. NON-VASCULAR Lower chest: Atelectatic changes/ scarring at the bilateral lung bases. No pericardial fluid. Hepatobiliary: Unremarkable appearance of the liver. Unremarkable gall bladder. Pancreas: Unremarkable appearance of the pancreas. No pericholecystic fluid or inflammatory changes. Unremarkable ductal system. Spleen: Unremarkable. Adrenals/Urinary Tract: Unremarkable appearance of adrenal glands. Right: No hydronephrosis. Symmetric perfusion to the left. No nephrolithiasis. Unremarkable course of the right ureter. Left: Malrotation left kidney. No hydronephrosis. Symmetric perfusion to the right. No nephrolithiasis. Unremarkable course of the left ureter. Unremarkable appearance of the urinary bladder . Stomach/Bowel: Unremarkable appearance of the stomach. Unremarkable appearance of small bowel. No evidence of obstruction. Colonic diverticula without  evidence of acute inflammatory changes. Appendix is not visualized, however, no inflammatory changes are present adjacent to the cecum to indicate an appendicitis. Lymphatic: Multiple lymph nodes in the para-aortic nodal station, none of which are enlarged. Mesenteric: No free fluid or air. No adenopathy. Reproductive: Unremarkable appearance of the pelvic organs. Other: No hernia. Musculoskeletal: No displaced fracture. Degenerative changes of the lumbar spine with no bony canal narrowing. Vacuum disc phenomenon at L4-L5. Partial sacralization of the L5 level with pseudoarticulation of the transverse process on the left. Early degenerative disc disease with broad-based disc bulge evident at L2-L3, L3-L4, and L4-L5 IMPRESSION: No acute finding of the vascular system. Minimal aortic atherosclerosis and lower extremity arterial disease with no significant stenosis or occlusion. The bilateral femoral popliteal system patent to the knee. Bilateral tibial vessels are poorly opacified, which is  favored to be late arrival of the contrast bolus. If further evaluation of the distal vessels is warranted, consider noninvasive exam with ABI and segmental examination. Atherosclerotic plaque at the origin of the superior mesenteric artery with perhaps 50% stenosis. The celiac artery and inferior mesenteric artery are patent. Early degenerative disc disease with broad-based disc bulge evident at L2-L3, L3-L4, L4-L5. Signed, Yvone Neu. Loreta Ave, DO [facility] Radiology Electronically Signed   By: Gilmer Mor D.O.   On: 10/22/2016 15:28    Procedures Procedures (including critical care time)  Medications Ordered in ED Medications  HYDROmorphone (DILAUDID) injection 0.5 mg (0.5 mg Intravenous Given 10/22/16 1253)  ondansetron (ZOFRAN) injection 4 mg (4 mg Intravenous Given 10/22/16 1253)     Initial Impression / Assessment and Plan / ED Course  I have reviewed the triage vital  signs and the nursing notes.  Pertinent labs & imaging results that were available during my care of the patient were reviewed by me and considered in my medical decision making (see chart for details).     Patient seen and examined. Work-up initiated. Medications ordered. LE doppler pending.   Vital signs reviewed and are as follows: Vitals:   10/22/16 1301 10/22/16 1545  BP: 139/76 132/81  Pulse: 65 (!) 53  Resp: 18 18  Temp:     Doppler neg. Pt d/w and seen by Dr. Silverio Lay. CT angiography ordered. CK ordered.   Testing demonstrates disc bulging, no other acute problems. Pt updated.   No red flag s/s of low back pain. Patient was counseled on back pain precautions and told to do activity as tolerated but do not lift, push, or pull heavy objects more than 10 pounds for the next week.  Patient counseled to use ice or heat on back for no longer than 15 minutes every hour.   Patient prescribed muscle relaxer and counseled on proper use of muscle relaxant medication.    Patient prescribed narcotic pain medicine and counseled on proper use of narcotic pain medications. Counseled not to combine this medication with others containing tylenol.   Urged patient not to drink alcohol, drive, or perform any other activities that requires focus while taking either of these medications.  Patient urged to follow-up with PCP if pain does not improve with treatment and rest or if pain becomes recurrent. Urged to return with worsening severe pain, loss of bowel or bladder control, trouble walking.   The patient verbalizes understanding and agrees with the plan.    Final Clinical Impressions(s) / ED Diagnoses   Final diagnoses:  Left leg pain   Pt with left leg pain. Suspect radicular type pain. CT angio neg. No DVT on doppler. No neurological deficits. No warning symptoms of back pain including: fecal incontinence, urinary retention or overflow incontinence, night sweats, waking from sleep with back  pain, unexplained fevers or weight loss, h/o cancer, IVDU, recent trauma. No concern for cauda equina, epidural abscess, or other serious cause of back pain. Conservative measures such as rest, ice/heat and pain medicine indicated with PCP follow-up if no improvement with conservative management.    New Prescriptions New Prescriptions   METHOCARBAMOL (ROBAXIN) 500 MG TABLET    Take 2 tablets (1,000 mg total) by mouth 4 (four) times daily.   OXYCODONE-ACETAMINOPHEN (PERCOCET/ROXICET) 5-325 MG TABLET    Take 1-2 tablets by mouth every 6 (six) hours as needed for severe pain.   PREDNISONE (DELTASONE) 20 MG TABLET    3 Tabs PO  Days 1-3, then 2 tabs PO Days 4-6, then 1 tab PO Day 7-9, then Half Tab PO Day 10-12     Renne Crigler, [place] 10/22/16 1619    Charlynne Pander, MD 10/23/16 [phone]

## 2016-10-31 DIAGNOSIS — M5137 Other intervertebral disc degeneration, lumbosacral region: Secondary | ICD-10-CM | POA: Diagnosis not present

## 2016-10-31 DIAGNOSIS — M545 Low back pain: Secondary | ICD-10-CM | POA: Diagnosis not present

## 2016-10-31 DIAGNOSIS — M5126 Other intervertebral disc displacement, lumbar region: Secondary | ICD-10-CM | POA: Diagnosis not present

## 2016-11-03 DIAGNOSIS — M5126 Other intervertebral disc displacement, lumbar region: Secondary | ICD-10-CM | POA: Diagnosis not present

## 2016-11-05 ENCOUNTER — Telehealth: Payer: Self-pay

## 2016-11-05 NOTE — Telephone Encounter (Signed)
Request for surgical clearance:   1. What type surgery is being performed? Lumbar Decompression  2. When is this surgery scheduled? pending  3. Are there any medications that need to be held prior to surgery and how long? N/A; pt is taking ASA 81 mg QD  4. Name of the physician performing surgery: Dr Tinnie GensJeffrey Beane  5. What is the office phone and fax number?   Phone [phone]  Fax [phone]   Patient has not been seen since 2015. Patient will need an appointment.  lmtcb

## 2016-11-05 NOTE — Telephone Encounter (Signed)
Needs to be seen in office please

## 2016-11-06 DIAGNOSIS — M5137 Other intervertebral disc degeneration, lumbosacral region: Secondary | ICD-10-CM | POA: Diagnosis not present

## 2016-11-06 DIAGNOSIS — M5126 Other intervertebral disc displacement, lumbar region: Secondary | ICD-10-CM | POA: Diagnosis not present

## 2016-11-06 DIAGNOSIS — M48062 Spinal stenosis, lumbar region with neurogenic claudication: Secondary | ICD-10-CM | POA: Diagnosis not present

## 2016-11-06 DIAGNOSIS — M5432 Sciatica, left side: Secondary | ICD-10-CM | POA: Diagnosis not present

## 2016-11-06 NOTE — Telephone Encounter (Signed)
Left message to call back and schedule appt for clearance. 4-2 or 4-3 is 1st available with PA's

## 2016-11-07 ENCOUNTER — Telehealth: Payer: Self-pay | Admitting: Cardiovascular Disease

## 2016-11-07 NOTE — Telephone Encounter (Signed)
See prior note about need for cardiac clearance.  Wife is not listed on a current DPR. I have left patient a message to call.

## 2016-11-07 NOTE — Telephone Encounter (Signed)
New Message     Pt wife needs to speak to someone TODAY, about getting surgical clearance before next week. They can not wait until 4/2.

## 2016-11-07 NOTE — Telephone Encounter (Signed)
Patient called back, appt scheduled for 11/17/16 at 9;30am with Corine ShelterLuke Kilroy. Added patient to waitlist per request.

## 2016-11-11 NOTE — Telephone Encounter (Signed)
Ladell HeadsNathan R Rose, RN 4 days ago     Patient called back, appt scheduled for 11/17/16 at 9;30am with Corine ShelterLuke Kilroy. Added patient to waitlist per request.

## 2016-11-17 ENCOUNTER — Ambulatory Visit (INDEPENDENT_AMBULATORY_CARE_PROVIDER_SITE_OTHER): Payer: Medicare Other | Admitting: Cardiology

## 2016-11-17 ENCOUNTER — Encounter: Payer: Self-pay | Admitting: Cardiology

## 2016-11-17 VITALS — BP 147/82 | HR 76 | Ht 72.0 in | Wt 249.0 lb

## 2016-11-17 DIAGNOSIS — M5137 Other intervertebral disc degeneration, lumbosacral region: Secondary | ICD-10-CM | POA: Diagnosis not present

## 2016-11-17 DIAGNOSIS — Z951 Presence of aortocoronary bypass graft: Secondary | ICD-10-CM

## 2016-11-17 DIAGNOSIS — M47817 Spondylosis without myelopathy or radiculopathy, lumbosacral region: Secondary | ICD-10-CM

## 2016-11-17 DIAGNOSIS — Z01810 Encounter for preprocedural cardiovascular examination: Secondary | ICD-10-CM | POA: Diagnosis not present

## 2016-11-17 DIAGNOSIS — I10 Essential (primary) hypertension: Secondary | ICD-10-CM | POA: Diagnosis not present

## 2016-11-17 DIAGNOSIS — Z01818 Encounter for other preprocedural examination: Secondary | ICD-10-CM | POA: Diagnosis not present

## 2016-11-17 DIAGNOSIS — E785 Hyperlipidemia, unspecified: Secondary | ICD-10-CM

## 2016-11-17 NOTE — Assessment & Plan Note (Signed)
Controlled-followed by PCP

## 2016-11-17 NOTE — Patient Instructions (Signed)
Medication Instructions:  Continue current medications  Labwork: None Ordered  Testing/Procedures: Your physician has requested that you have a lexiscan myoview. For further information please visit [email or web address]. Please follow instruction sheet, as given.  Follow-Up: Your physician wants you to follow-up in: 6 Months with Dr Croitoru. You will receive a reminder letter in the mail two months in advance. If you don't receive a letter, please call our office to schedule the follow-up appointment.   Any Other Special Instructions Will Be Listed Below (If Applicable).  If you need a refill on your cardiac medications before your next appointment, please call your pharmacy.   

## 2016-11-17 NOTE — Assessment & Plan Note (Signed)
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

## 2016-11-17 NOTE — Assessment & Plan Note (Signed)
Pre op cardiovascular exam- pre back surgery

## 2016-11-17 NOTE — Assessment & Plan Note (Signed)
Followed by PCP- he is on low dose Lipitor (LDL goal at least < 70).

## 2016-11-17 NOTE — Assessment & Plan Note (Signed)
S/P CABG x 4, 04/2010. Dr Morton Peters

## 2016-11-17 NOTE — Progress Notes (Signed)
11/17/2016 Adrian [name] [facility]   28-Apr-1951  [id number]  Primary Physician Astrid Divine, MD Primary Cardiologist: Dr Royann Shivers  HPI:  66 y/o male, followed by Dr Royann Shivers with a history of CAD- s/p CABG x 4 in 2011. The pt says he had a stress test (treadmill) and was told it was normal. The was admitted 4 days later with [place] and had CABG. His LOV with Dr Royann Shivers was Aug 2015. When I asked him why he hasn't been back he said :i'Ve been doing OK". Records indicate he was seen in the ED Sept 2017 for chest pain and SOB. MI r/o and he had no further symptoms so he never followed up with [place]. In Jan 2018 he had rectal bleeding felt to be secondary to colitis, this has not recurred. He denies angina. He walks daily and rides an exercise bike 15-20 minutes twice a day. He has never used NTG.   He went to the ED 10/22/16 after he woke up with numbness in his Lt thigh. This progressed to pain and migrated down his leg. In the ED he had a CTA to r/o arterial disease (this was negative). The scan did show early degenerative disc disease with broad-based disc bulge evident at L2-L3, L3-L4, L4-L5. He was seen by Dr Jillyn Hidden. He'll need surgery and is in the office today for pre op clearance.    Current Outpatient Prescriptions  Medication Sig Dispense Refill  . aspirin 81 MG tablet Take 81 mg by mouth every morning.     Marland Kitchen atorvastatin (LIPITOR) 20 MG tablet Take 20 mg by mouth every morning.     . hydrochlorothiazide (HYDRODIURIL) 12.5 MG tablet Take 12.5 mg by mouth every morning.     Marland Kitchen ibuprofen (ADVIL,MOTRIN) 200 MG tablet Take 200-800 mg by mouth every 6 (six) hours as needed for headache.    . losartan (COZAAR) 100 MG tablet Take 100 mg by mouth every morning.     . methocarbamol (ROBAXIN) 500 MG tablet Take 2 tablets (1,000 mg total) by mouth 4 (four) times daily. 30 tablet 0   No current facility-administered medications for this visit.     No Known Allergies  Past Medical History:  Diagnosis  Date  . CAD (coronary artery disease)   . Chest wall pain following surgery   . Diabetes mellitus without complication (HCC)   . Hyperlipidemia   . Hypertension   . Pleural effusion, left     Social History   Social History  . Marital status: Married    Spouse name: N/A  . Number of children: N/A  . Years of education: N/A   Occupational History  . Not on file.   Social History Main Topics  . Smoking status: Never Smoker  . Smokeless tobacco: Never Used  . Alcohol use Yes     Comment: 3x a week  . Drug use: No  . Sexual activity: Not on file   Other Topics Concern  . Not on file   Social History Narrative  . No narrative on file     Family History  Problem Relation Age of Onset  . Family history unknown: Yes     Review of Systems: General: negative for chills, fever, night sweats or weight changes.  Cardiovascular: negative for chest pain, dyspnea on exertion, edema, orthopnea, palpitations, paroxysmal nocturnal dyspnea or shortness of breath Dermatological: negative for rash Respiratory: negative for cough or wheezing Urologic: negative for hematuria Abdominal: negative for nausea, vomiting, diarrhea, bright red  blood per rectum, melena, or hematemesis Neurologic: negative for visual changes, syncope, or dizziness All other systems reviewed and are otherwise negative except as noted above.    Blood pressure (!) 147/82, pulse 76, height 6' (1.829 m), weight 249 lb (112.9 kg).  General appearance: alert, cooperative, no distress and moderately obese Neck: no carotid bruit and no JVD Lungs: clear to auscultation bilaterally Heart: regular rate and rhythm Abdomen: obese, non tender Extremities: no edema Pulses: 2+ and symmetric Skin: Skin color, texture, turgor normal. No rashes or lesions Neurologic: Grossly normal, LT LE weakness- using a cane  EKG NSR-HR 61- TWI1, AVL, V2- no change from Sept 2017  ASSESSMENT AND PLAN:   Pre-operative cardiovascular  examination Pre op cardiovascular exam- pre back surgery  Hx of CABG S/P CABG x 4, 04/2010. Dr Zenaida Niece Tright  Benign essential HTN Controlled-followed by PCP  Dyslipidemia Followed by PCP- he is on low dose Lipitor (LDL goal at least < 70).  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 The pt has not had a functional study in > 7 yrs. We'll arrange for a Lexiscan Myoview to be done ASAP- pre op clearance pending this.   Corine Shelter PA-C 11/17/2016 10:16 AM

## 2016-11-18 ENCOUNTER — Telehealth (HOSPITAL_COMMUNITY): Payer: Self-pay

## 2016-11-18 NOTE — Telephone Encounter (Signed)
Encounter complete. 

## 2016-11-19 ENCOUNTER — Ambulatory Visit (HOSPITAL_COMMUNITY)
Admission: RE | Admit: 2016-11-19 | Discharge: 2016-11-19 | Disposition: A | Discharge status: Home / Self Care | Payer: Medicare Other | Source: Ambulatory Visit | Attending: Cardiovascular Disease | Admitting: Cardiovascular Disease

## 2016-11-19 DIAGNOSIS — Z8249 Family history of ischemic heart disease and other diseases of the circulatory system: Secondary | ICD-10-CM | POA: Insufficient documentation

## 2016-11-19 DIAGNOSIS — R079 Chest pain, unspecified: Secondary | ICD-10-CM | POA: Diagnosis not present

## 2016-11-19 DIAGNOSIS — Z01818 Encounter for other preprocedural examination: Secondary | ICD-10-CM | POA: Diagnosis not present

## 2016-11-19 DIAGNOSIS — E119 Type 2 diabetes mellitus without complications: Secondary | ICD-10-CM | POA: Insufficient documentation

## 2016-11-19 DIAGNOSIS — I10 Essential (primary) hypertension: Secondary | ICD-10-CM | POA: Insufficient documentation

## 2016-11-19 DIAGNOSIS — Z6833 Body mass index (BMI) 33.0-33.9, adult: Secondary | ICD-10-CM | POA: Diagnosis not present

## 2016-11-19 DIAGNOSIS — E663 Overweight: Secondary | ICD-10-CM | POA: Diagnosis not present

## 2016-11-19 LAB — MYOCARDIAL PERFUSION IMAGING
LV dias vol: 124 mL (ref 62–150)
LV sys vol: 48 mL
Peak HR: 77 {beats}/min
Rest HR: 54 {beats}/min
SDS: 5
SRS: 2
SSS: 7
TID: 1.22

## 2016-11-19 MED ORDER — TECHNETIUM TC 99M TETROFOSMIN IV KIT
10.5000 | PACK | Freq: Once | INTRAVENOUS | Status: AC | PRN
  Administered 2016-11-19: 10.5 via INTRAVENOUS
  Filled 2016-11-19: qty 11

## 2016-11-19 MED ORDER — TECHNETIUM TC 99M TETROFOSMIN IV KIT
30.4000 | PACK | Freq: Once | INTRAVENOUS | Status: AC | PRN
  Administered 2016-11-19: 30.4 via INTRAVENOUS
  Filled 2016-11-19: qty 31

## 2016-11-19 MED ORDER — REGADENOSON 0.4 MG/5ML IV SOLN
0.4000 mg | Freq: Once | INTRAVENOUS | Status: AC
  Administered 2016-11-19: 0.4 mg via INTRAVENOUS

## 2016-11-20 ENCOUNTER — Telehealth: Payer: Self-pay | Admitting: *Deleted

## 2016-11-20 NOTE — Telephone Encounter (Signed)
Left msg to call.

## 2016-11-20 NOTE — Telephone Encounter (Signed)
-----   Message from Abelino Derrick, [place] sent at 11/20/2016  7:45 AM EDT ----- Please let the pt know his Myoview was normal. From a cardiac standpoint he is OK to proceed with back surgery. Also send a pre op clearance to Dr Concha Norway office.  Corine Shelter PA-C 11/20/2016 7:44 AM

## 2016-11-21 NOTE — Telephone Encounter (Signed)
LM w/results on name-verified VM. Routed clearance/myoview result to Dr. Shelle Iron

## 2016-11-21 NOTE — Telephone Encounter (Signed)
Patient returned call. Made aware of results.

## 2016-11-21 NOTE — Telephone Encounter (Signed)
F/U  Patient is returning a call from [facility] regarding test results

## 2016-11-26 ENCOUNTER — Telehealth: Payer: Self-pay | Admitting: Cardiovascular Disease

## 2016-11-26 NOTE — Telephone Encounter (Signed)
Contacted Sherry @ Dr. Ermelinda Das office. They received stress test report but cannot see Adrian [name], [place] note where it states he is OK for surgery. Printed stress test report and faxed to [phone]

## 2016-11-26 NOTE — Telephone Encounter (Signed)
Wife is calling,said Dr Jillyn Hidden office still have not received the surgical clearance. The results of the test were sent,but not the clearance. Sherry from Dr Veronda Prude office faxed another clearance today. Please return this asap.

## 2016-11-26 NOTE — Telephone Encounter (Signed)
Please fax the clearance today if possible.

## 2016-11-28 ENCOUNTER — Ambulatory Visit: Payer: Self-pay | Admitting: Orthopedic Surgery

## 2016-12-01 ENCOUNTER — Ambulatory Visit: Payer: Self-pay | Admitting: Orthopedic Surgery

## 2016-12-01 NOTE — H&P (Signed)
Adrian [name] is an 66 y.o. male.   Chief Complaint: back and leg pain HPI: The patient is a 66 year old male who presents today for follow up of their back. The patient is being followed for their left-sided back pain. They are now 15 day(s) out from when symptoms began. Symptoms reported today include: pain and leg pain (LLE). Current treatment includes: activity modification and Gabapentin and Robaxin. The patient reports their current pain level to be 6 / 10. The patient presents today following MRI.  [facility] follows up with his MRI. I called him at home. 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 He reports no changes, severe pain down to his left anterior thigh. He has difficulty straightening the leg out or flexing his hip up. He is here with his wife.  REVIEW OF SYSTEMS Review of systems is negative for fevers, chest pain, shortness of breath, unexplained recent weight loss, loss of bowel or bladder function, burning with urination, joint swelling, rashes, weakness or numbness, difficulty with balance, easy bruising, excessive thirst or frequent urination.  Past Medical History:  Diagnosis Date  . CAD (coronary artery disease)   . Chest wall pain following surgery   . Diabetes mellitus without complication (HCC)   . Hyperlipidemia   . Hypertension   . Pleural effusion, left     Past Surgical History:  Procedure Laterality Date  . CARDIAC CATHETERIZATION  04/29/10   LEFT MAIN-FAIRLY SHORT VESSEL, FREE OF DISEASE. LAD 70-90%. A CERTAIN SEGMENT APPEARS SLIGHTLY ULCERATED, THERE MAYBE ACTIVE RUPTURED PLAQUE. RAMUS INTERMEDIUS 95% IN THE PROXIMAL THIRD. LEFT CX 70-80% STENOSIS IN THE MID OM. RCA 40-50% OSTIAL STENOSIS. REFERRED FOR CABG.  . CAROTID DUPLEX Bilateral 04/29/10   NO ICA STENOSIS BIL.+  . CORONARY ARTERY BYPASS GRAFT  04/30/10   X4  . Coronary artery bypass  grafting  05/01/2010  . lumbar laminectomy in 2006 by Dr. Pennie Banter    . NUCLEAR STRESS TEST N/A 03/01/10   NORMAL PERFUSION PATTERN IN ALL REGIONS. EF 69%.NL MYOCARDIAL PERFUSION STUDY.  . TRANSTHORACIC ECHOCARDIOGRAM N/A 04/27/10   LV SIZE IS NORMAL. SYSTOLIC FUNC IS NORMAL.    Social History:  reports that he has never smoked. He has never used smokeless tobacco. He reports that he drinks alcohol. He reports that he does not use drugs.  Allergies: No Known Allergies  Family History Cancer  Father. Heart Disease  Mother. Congestive Heart Failure  Mother. First Degree Relatives  reported Hypertension  Mother. Heart disease in male family member before age 21   Medication History  Robaxin (  Tablet, 1 (one) Tablet Oral every eight hours, as needed, Taken starting 10/31/2016) Active. Neurontin (  Capsule, 1 (one) Capsule Oral 1 qhs x 3days, 1 bid x 3days, then tid, Taken starting 10/31/2016) Active. (1 qhs x 3 days, 1 bid x 3days, then tid) PredniSONE (  Tablet, Oral) Active. Oxycodone-Acetaminophen (5-325MG  Tablet, Oral) Active. Lipitor (  Tablet, Oral) Active. Losartan Potassium (  Tablet, Oral) Active. Hydrochlorothiazide (12.5MG  Tablet, Oral) Active. Bayer Aspirin EC Low Dose (  Tablet DR, Oral) Active. Medications Reconciled  Review of Systems  Constitutional: Negative.   HENT: Negative.   Eyes: Negative.   Respiratory: Negative.   Cardiovascular: Negative.   Gastrointestinal: Negative.   Genitourinary: Negative.   Musculoskeletal: Positive for back pain.  Skin: Negative.   Neurological: Positive for sensory change and focal weakness.  There were no vitals taken for this visit. Physical Exam  Constitutional: He is oriented to person, place, and time. He appears well-developed. He appears distressed.  HENT:  Head: Normocephalic.  Eyes: Pupils are equal, round, and reactive to light.  Neck: Normal range of motion.   Cardiovascular: Normal rate.   Respiratory: Effort normal.  GI: Soft.  Musculoskeletal:  He is upright, in moderate distress. Mood and affect is appropriate. He has 3/5 hip flexor weakness as well as 4/5 quad weakness and tibialis anterior. Altered sensation in the L4 dermatome. Absent knee reflex. Pain with extension. Lumbar spine exam reveals no evidence of soft tissue swelling, deformity or skin ecchymosis. On palpation there is no tenderness of the lumbar spine. No flank pain with percussion. The abdomen is soft and nontender. Nontender over the trochanters. No cellulitis or lymphadenopathy.  Motor is 5/5 including EHL and hamstrings. There is no Babinski or clonus. Patient has good distal pulses. No DVT. No pain and normal range of motion without instability of the hips, knees and ankles.  Painless range of motion of the cervical spine.  Neurological: He is alert and oriented to person, place, and time.  Skin: Skin is warm and dry.    MRI was reviewed with the patient in detail indicating compression of the L3-4 root by an extruded fragment.   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  Given the duration of his symptoms and the presence of neurologic deficit and neural compressive lesion, we discussed decompression to remove the herniated fragment. It would require probably decompression at L2-3, perhaps to extend down to L3-4 to retrieve the extruded fragment.  I had an extensive discussion of the risks and benefits of the lumbar decompression with the patient including bleeding, infection, damage to neurovascular structures, epidural fibrosis, CSF leak requiring repair. We also discussed increase in pain, adjacent segment disease, recurrent disc herniation, need for future surgery including repeat decompression and/or fusion. We also discussed risks of  postoperative hematoma, paralysis, anesthetic complications including DVT, PE, death, cardiopulmonary dysfunction. In addition, the perioperative and postoperative courses were discussed in detail including the rehabilitative time and return to functional activity and work. I provided the patient with an illustrated handout and utilized the appropriate surgical models.  We discussed at length the procedure in detail using a PowerPoint presentation and the model and spent considerable time discussing all of these issues. We discussed preoperative course and postoperative course in detail. We obtained kindly preoperative clearance. Use a cane and analgesics. Any definitive changes in the interim, he is to call. We will proceed as soon as we obtain clearance.  Plan microlumbar decompression L2-3, possible L3-4  Kyilee Gregg, Dayna Barker., PA-C for Dr. Shelle Iron 12/01/2016, 8:38 AM

## 2016-12-03 ENCOUNTER — Other Ambulatory Visit: Payer: Self-pay | Admitting: Specialist

## 2016-12-08 ENCOUNTER — Other Ambulatory Visit (HOSPITAL_COMMUNITY): Payer: Self-pay | Admitting: Emergency Medicine

## 2016-12-08 NOTE — Patient Instructions (Addendum)
Adrian [name] [facility]  12/08/2016   Your procedure is scheduled on: 12-17-16  Report to [facility] Main  Entrance Take [place]  elevators to 3rd floor to  Short Stay Center at 930AM.   Call this number if you have problems the morning of surgery [phone]    Remember: ONLY 1 PERSON MAY GO WITH YOU TO SHORT STAY TO GET  READY MORNING OF YOUR SURGERY.  Do not eat food or drink liquids :After Midnight.     Take these medicines the morning of surgery with A SIP OF WATER: atorvastatin(lipitor), gabapentin(neurontin)                                You may not have any metal on your body including hair pins and              piercings  Do not wear jewelry, make-up, lotions, powders or perfumes, deodorant                 Men may shave face and neck.   Do not bring valuables to the hospital. [facility] IS NOT             RESPONSIBLE   FOR VALUABLES.  Contacts, dentures or bridgework may not be worn into surgery.  Leave suitcase in the car. After surgery it may be brought to your room.                Please read over the following fact sheets you were given: _____________________________________________________________________             [facility] - Preparing for Surgery Before surgery, you can play an important role.  Because skin is not sterile, your skin needs to be as free of germs as possible.  You can reduce the number of germs on your skin by washing with CHG (chlorahexidine gluconate) soap before surgery.  CHG is an antiseptic cleaner which kills germs and bonds with the skin to continue killing germs even after washing. Please DO NOT use if you have an allergy to CHG or antibacterial soaps.  If your skin becomes reddened/irritated stop using the CHG and inform your nurse when you arrive at Short Stay. Do not shave (including legs and underarms) for at least 48 hours prior to the first CHG shower.  You may shave your face/neck. Please follow these instructions  carefully:  1.  Shower with CHG Soap the night before surgery and the  morning of Surgery.  2.  If you choose to wash your hair, wash your hair first as usual with your  normal  shampoo.  3.  After you shampoo, rinse your hair and body thoroughly to remove the  shampoo.                           4.  Use CHG as you would any other liquid soap.  You can apply chg directly  to the skin and wash                       Gently with a scrungie or clean washcloth.  5.  Apply the CHG Soap to your body ONLY FROM THE NECK DOWN.   Do not use on face/ open  Wound or open sores. Avoid contact with eyes, ears mouth and genitals (private parts).                       Wash face,  Genitals (private parts) with your normal soap.             6.  Wash thoroughly, paying special attention to the area where your surgery  will be performed.  7.  Thoroughly rinse your body with warm water from the neck down.  8.  DO NOT shower/wash with your normal soap after using and rinsing off  the CHG Soap.                9.  Pat yourself dry with a clean towel.            10.  Wear clean pajamas.            11.  Place clean sheets on your bed the night of your first shower and do not  sleep with pets. Day of Surgery : Do not apply any lotions/deodorants the morning of surgery.  Please wear clean clothes to the hospital/surgery center.  FAILURE TO FOLLOW THESE INSTRUCTIONS MAY RESULT IN THE CANCELLATION OF YOUR SURGERY   ________________________________________________________________________    Patient Signature:  Date:   Nurse Signature:  Date:   Reviewed and Endorsed by [facility] Patient Education Committee, August 2015  Incentive Spirometer  An incentive spirometer is a tool that can help keep your lungs clear and active. This tool measures how well you are filling your lungs with each breath. Taking long deep breaths may help reverse or decrease the chance of developing breathing  (pulmonary) problems (especially infection) following:  A long period of time when you are unable to move or be active. BEFORE THE PROCEDURE   If the spirometer includes an indicator to show your best effort, your nurse or respiratory therapist will set it to a desired goal.  If possible, sit up straight or lean slightly forward. Try not to slouch.  Hold the incentive spirometer in an upright position. INSTRUCTIONS FOR USE  1. Sit on the edge of your bed if possible, or sit up as far as you can in bed or on a chair. 2. Hold the incentive spirometer in an upright position. 3. Breathe out normally. 4. Place the mouthpiece in your mouth and seal your lips tightly around it. 5. Breathe in slowly and as deeply as possible, raising the piston or the ball toward the top of the column. 6. Hold your breath for 3-5 seconds or for as long as possible. Allow the piston or ball to fall to the bottom of the column. 7. Remove the mouthpiece from your mouth and breathe out normally. 8. Rest for a few seconds and repeat Steps 1 through 7 at least 10 times every 1-2 hours when you are awake. Take your time and take a few normal breaths between deep breaths. 9. The spirometer may include an indicator to show your best effort. Use the indicator as a goal to work toward during each repetition. 10. After each set of 10 deep breaths, practice coughing to be sure your lungs are clear. If you have an incision (the cut made at the time of surgery), support your incision when coughing by placing a pillow or rolled up towels firmly against it. Once you are able to get out of bed, walk around indoors and cough well. You may stop using  the incentive spirometer when instructed by your caregiver.  RISKS AND COMPLICATIONS  Take your time so you do not get dizzy or light-headed.  If you are in pain, you may need to take or ask for pain medication before doing incentive spirometry. It is harder to take a deep breath if you  are having pain. AFTER USE  Rest and breathe slowly and easily.  It can be helpful to keep track of a log of your progress. Your caregiver can provide you with a simple table to help with this. If you are using the spirometer at home, follow these instructions: SEEK MEDICAL CARE IF:   You are having difficultly using the spirometer.  You have trouble using the spirometer as often as instructed.  Your pain medication is not giving enough relief while using the spirometer.  You develop fever of 100.5 F (38.1 C) or higher. SEEK IMMEDIATE MEDICAL CARE IF:   You cough up bloody sputum that had not been present before.  You develop fever of 102 F (38.9 C) or greater.  You develop worsening pain at or near the incision site. MAKE SURE YOU:   Understand these instructions.  Will watch your condition.  Will get help right away if you are not doing well or get worse. Document Released: 12/15/2006 Document Revised: 10/27/2011 Document Reviewed: 02/15/2007 [facility] Patient Information 2014 [place], [place].   ________________________________________________________________________

## 2016-12-08 NOTE — Progress Notes (Signed)
LOV cardiology Kilroy PA 11-17-16 epic Stress test 11-19-16 epic  EKG 11-17-16 epic CT chest 04-30-16 epic

## 2016-12-11 ENCOUNTER — Encounter (HOSPITAL_COMMUNITY): Payer: Self-pay

## 2016-12-11 ENCOUNTER — Encounter (HOSPITAL_COMMUNITY)
Admission: RE | Admit: 2016-12-11 | Discharge: 2016-12-11 | Disposition: A | Discharge status: Home / Self Care | Payer: Medicare Other | Source: Ambulatory Visit | Attending: Specialist | Admitting: Specialist

## 2016-12-11 ENCOUNTER — Ambulatory Visit (HOSPITAL_COMMUNITY)
Admission: RE | Admit: 2016-12-11 | Discharge: 2016-12-11 | Disposition: A | Discharge status: Home / Self Care | Payer: Medicare Other | Source: Ambulatory Visit | Attending: Orthopedic Surgery | Admitting: Orthopedic Surgery

## 2016-12-11 DIAGNOSIS — M5126 Other intervertebral disc displacement, lumbar region: Secondary | ICD-10-CM | POA: Insufficient documentation

## 2016-12-11 DIAGNOSIS — Z01818 Encounter for other preprocedural examination: Secondary | ICD-10-CM | POA: Insufficient documentation

## 2016-12-11 DIAGNOSIS — M5136 Other intervertebral disc degeneration, lumbar region: Secondary | ICD-10-CM | POA: Diagnosis not present

## 2016-12-11 HISTORY — DX: Dyspnea, unspecified: R06.00

## 2016-12-11 HISTORY — DX: Acute myocardial infarction, unspecified: I21.9

## 2016-12-11 LAB — BASIC METABOLIC PANEL
Anion gap: 8 (ref 5–15)
BUN: 24 mg/dL — ABNORMAL HIGH (ref 6–20)
CHLORIDE: 105 mmol/L (ref 101–111)
CO2: 27 mmol/L (ref 22–32)
CREATININE: 1.08 mg/dL (ref 0.61–1.24)
Calcium: 9.5 mg/dL (ref 8.9–10.3)
GFR calc non Af Amer: >60 mL/min (ref >60)
Glucose, Bld: 112 mg/dL — ABNORMAL HIGH (ref 65–99)
POTASSIUM: 4.2 mmol/L (ref 3.5–5.1)
SODIUM: 140 mmol/L (ref 135–145)

## 2016-12-11 LAB — SURGICAL PCR SCREEN
MRSA, PCR: NEGATIVE
STAPHYLOCOCCUS AUREUS: NEGATIVE

## 2016-12-11 LAB — CBC
HCT: 42 % (ref 39.0–52.0)
Hemoglobin: 14.2 g/dL (ref 13.0–17.0)
MCH: 28.6 pg (ref 26.0–34.0)
MCHC: 33.8 g/dL (ref 30.0–36.0)
MCV: 84.5 fL (ref 78.0–100.0)
Platelets: 196 10*3/uL (ref 150–400)
RBC: 4.97 MIL/uL (ref 4.22–5.81)
RDW: 13.1 % (ref 11.5–15.5)
WBC: 9.1 10*3/uL (ref 4.0–10.5)

## 2016-12-17 ENCOUNTER — Encounter (HOSPITAL_COMMUNITY): Payer: Self-pay | Admitting: *Deleted

## 2016-12-17 ENCOUNTER — Observation Stay (HOSPITAL_COMMUNITY)
Admission: RE | Admit: 2016-12-17 | Discharge: 2016-12-18 | Disposition: A | Discharge status: Home / Self Care | Payer: Medicare Other | Source: Ambulatory Visit | Attending: Specialist | Admitting: Specialist

## 2016-12-17 ENCOUNTER — Ambulatory Visit (HOSPITAL_COMMUNITY): Payer: Medicare Other

## 2016-12-17 ENCOUNTER — Ambulatory Visit (HOSPITAL_COMMUNITY): Payer: Medicare Other | Admitting: Certified Registered Nurse Anesthetist

## 2016-12-17 ENCOUNTER — Encounter (HOSPITAL_COMMUNITY)
Admission: RE | Disposition: A | Discharge status: Home / Self Care | Payer: Self-pay | Source: Ambulatory Visit | Attending: Specialist

## 2016-12-17 DIAGNOSIS — Z809 Family history of malignant neoplasm, unspecified: Secondary | ICD-10-CM | POA: Insufficient documentation

## 2016-12-17 DIAGNOSIS — Z79899 Other long term (current) drug therapy: Secondary | ICD-10-CM | POA: Diagnosis not present

## 2016-12-17 DIAGNOSIS — Z8249 Family history of ischemic heart disease and other diseases of the circulatory system: Secondary | ICD-10-CM | POA: Diagnosis not present

## 2016-12-17 DIAGNOSIS — M48062 Spinal stenosis, lumbar region with neurogenic claudication: Secondary | ICD-10-CM | POA: Diagnosis not present

## 2016-12-17 DIAGNOSIS — I252 Old myocardial infarction: Secondary | ICD-10-CM | POA: Diagnosis not present

## 2016-12-17 DIAGNOSIS — Z7982 Long term (current) use of aspirin: Secondary | ICD-10-CM | POA: Insufficient documentation

## 2016-12-17 DIAGNOSIS — M48061 Spinal stenosis, lumbar region without neurogenic claudication: Secondary | ICD-10-CM | POA: Diagnosis not present

## 2016-12-17 DIAGNOSIS — M5116 Intervertebral disc disorders with radiculopathy, lumbar region: Secondary | ICD-10-CM | POA: Diagnosis not present

## 2016-12-17 DIAGNOSIS — G061 Intraspinal abscess and granuloma: Secondary | ICD-10-CM | POA: Diagnosis not present

## 2016-12-17 DIAGNOSIS — I10 Essential (primary) hypertension: Secondary | ICD-10-CM | POA: Insufficient documentation

## 2016-12-17 DIAGNOSIS — I2510 Atherosclerotic heart disease of native coronary artery without angina pectoris: Secondary | ICD-10-CM | POA: Diagnosis not present

## 2016-12-17 DIAGNOSIS — E119 Type 2 diabetes mellitus without complications: Secondary | ICD-10-CM | POA: Insufficient documentation

## 2016-12-17 DIAGNOSIS — M5126 Other intervertebral disc displacement, lumbar region: Secondary | ICD-10-CM | POA: Diagnosis not present

## 2016-12-17 DIAGNOSIS — Z951 Presence of aortocoronary bypass graft: Secondary | ICD-10-CM | POA: Insufficient documentation

## 2016-12-17 DIAGNOSIS — I878 Other specified disorders of veins: Secondary | ICD-10-CM | POA: Insufficient documentation

## 2016-12-17 DIAGNOSIS — Z419 Encounter for procedure for purposes other than remedying health state, unspecified: Secondary | ICD-10-CM

## 2016-12-17 DIAGNOSIS — E785 Hyperlipidemia, unspecified: Secondary | ICD-10-CM | POA: Diagnosis not present

## 2016-12-17 HISTORY — PX: LUMBAR LAMINECTOMY/DECOMPRESSION MICRODISCECTOMY: SHX5026

## 2016-12-17 SURGERY — LUMBAR LAMINECTOMY/DECOMPRESSION MICRODISCECTOMY 2 LEVELS
Anesthesia: General | Site: Back | Laterality: Left

## 2016-12-17 MED ORDER — ONDANSETRON HCL 4 MG/2ML IJ SOLN: 4.0000 mg | Freq: Four times a day (QID) | INTRAMUSCULAR | Status: DC | PRN

## 2016-12-17 MED ORDER — ACETAMINOPHEN 650 MG RE SUPP: 650.0000 mg | RECTAL | Status: DC | PRN

## 2016-12-17 MED ORDER — MAGNESIUM CITRATE PO SOLN: 1.0000 | Freq: Once | ORAL | Status: DC | PRN

## 2016-12-17 MED ORDER — PROPOFOL 10 MG/ML IV BOLUS
INTRAVENOUS | Status: DC | PRN
  Administered 2016-12-17: 150 mg via INTRAVENOUS

## 2016-12-17 MED ORDER — ROCURONIUM BROMIDE 50 MG/5ML IV SOSY
PREFILLED_SYRINGE | INTRAVENOUS | Status: DC | PRN
  Administered 2016-12-17: 10 mg via INTRAVENOUS
  Administered 2016-12-17: 50 mg via INTRAVENOUS
  Administered 2016-12-17: 20 mg via INTRAVENOUS

## 2016-12-17 MED ORDER — BISACODYL 5 MG PO TBEC: 5.0000 mg | DELAYED_RELEASE_TABLET | Freq: Every day | ORAL | Status: DC | PRN

## 2016-12-17 MED ORDER — ACETAMINOPHEN 10 MG/ML IV SOLN
1000.0000 mg | Freq: Four times a day (QID) | INTRAVENOUS | Status: DC
  Administered 2016-12-17: 1000 mg via INTRAVENOUS
  Filled 2016-12-17: qty 100

## 2016-12-17 MED ORDER — ONDANSETRON HCL 4 MG PO TABS: 4.0000 mg | ORAL_TABLET | Freq: Four times a day (QID) | ORAL | Status: DC | PRN

## 2016-12-17 MED ORDER — HYDROMORPHONE HCL 1 MG/ML IJ SOLN
0.2500 mg | INTRAMUSCULAR | Status: DC | PRN
  Administered 2016-12-17 (×3): 0.25 mg via INTRAVENOUS

## 2016-12-17 MED ORDER — GABAPENTIN 300 MG PO CAPS
300.0000 mg | ORAL_CAPSULE | Freq: Two times a day (BID) | ORAL | Status: DC
  Administered 2016-12-17 – 2016-12-18 (×2): 300 mg via ORAL
  Filled 2016-12-17 (×2): qty 1

## 2016-12-17 MED ORDER — FENTANYL CITRATE (PF) 100 MCG/2ML IJ SOLN
INTRAMUSCULAR | Status: DC | PRN
  Administered 2016-12-17: 100 ug via INTRAVENOUS
  Administered 2016-12-17 (×2): 50 ug via INTRAVENOUS

## 2016-12-17 MED ORDER — FENTANYL CITRATE (PF) 250 MCG/5ML IJ SOLN
INTRAMUSCULAR | Status: AC
  Filled 2016-12-17: qty 5

## 2016-12-17 MED ORDER — DOCUSATE SODIUM 100 MG PO CAPS: 100.0000 mg | ORAL_CAPSULE | Freq: Two times a day (BID) | ORAL | Status: DC

## 2016-12-17 MED ORDER — MENTHOL 3 MG MT LOZG: 1.0000 | LOZENGE | OROMUCOSAL | Status: DC | PRN

## 2016-12-17 MED ORDER — PROPOFOL 10 MG/ML IV BOLUS
INTRAVENOUS | Status: AC
  Filled 2016-12-17: qty 20

## 2016-12-17 MED ORDER — POLYETHYLENE GLYCOL 3350 17 G PO PACK: 17.0000 g | PACK | Freq: Every day | ORAL | Status: DC | PRN

## 2016-12-17 MED ORDER — PHENOL 1.4 % MT LIQD
1.0000 | OROMUCOSAL | Status: DC | PRN
  Filled 2016-12-17: qty 177

## 2016-12-17 MED ORDER — CEFAZOLIN SODIUM-DEXTROSE 2-4 GM/100ML-% IV SOLN
2.0000 g | INTRAVENOUS | Status: AC
  Administered 2016-12-17: 2 g via INTRAVENOUS
  Filled 2016-12-17: qty 100

## 2016-12-17 MED ORDER — PHENYLEPHRINE 40 MCG/ML (10ML) SYRINGE FOR IV PUSH (FOR BLOOD PRESSURE SUPPORT)
PREFILLED_SYRINGE | INTRAVENOUS | Status: DC | PRN
  Administered 2016-12-17: 40 ug via INTRAVENOUS
  Administered 2016-12-17: 80 ug via INTRAVENOUS
  Administered 2016-12-17: 120 ug via INTRAVENOUS
  Administered 2016-12-17 (×2): 80 ug via INTRAVENOUS
  Administered 2016-12-17: 40 ug via INTRAVENOUS
  Administered 2016-12-17 (×4): 80 ug via INTRAVENOUS
  Administered 2016-12-17: 40 ug via INTRAVENOUS

## 2016-12-17 MED ORDER — EPHEDRINE 5 MG/ML INJ
INTRAVENOUS | Status: AC
  Filled 2016-12-17: qty 10

## 2016-12-17 MED ORDER — METHOCARBAMOL 500 MG PO TABS: 500.0000 mg | ORAL_TABLET | Freq: Four times a day (QID) | ORAL | Status: DC | PRN

## 2016-12-17 MED ORDER — LIDOCAINE 2% (20 MG/ML) 5 ML SYRINGE
INTRAMUSCULAR | Status: DC | PRN
  Administered 2016-12-17: 100 mg via INTRAVENOUS

## 2016-12-17 MED ORDER — METHOCARBAMOL 500 MG PO TABS
500.0000 mg | ORAL_TABLET | Freq: Four times a day (QID) | ORAL | Status: DC | PRN
  Administered 2016-12-17 – 2016-12-18 (×2): 500 mg via ORAL
  Filled 2016-12-17 (×2): qty 1

## 2016-12-17 MED ORDER — SUGAMMADEX SODIUM 500 MG/5ML IV SOLN
INTRAVENOUS | Status: AC
  Filled 2016-12-17: qty 5

## 2016-12-17 MED ORDER — METHOCARBAMOL 500 MG PO TABS: 500.0000 mg | ORAL_TABLET | Freq: Four times a day (QID) | ORAL | 1 refills | Status: DC | PRN

## 2016-12-17 MED ORDER — MIDAZOLAM HCL 2 MG/2ML IJ SOLN
INTRAMUSCULAR | Status: AC
  Filled 2016-12-17: qty 2

## 2016-12-17 MED ORDER — ACETAMINOPHEN 325 MG PO TABS
650.0000 mg | ORAL_TABLET | ORAL | Status: DC | PRN
  Administered 2016-12-18: 650 mg via ORAL
  Filled 2016-12-17: qty 2

## 2016-12-17 MED ORDER — HEMOSTATIC AGENTS (NO CHARGE) OPTIME
TOPICAL | Status: DC | PRN
  Administered 2016-12-17: 1 via TOPICAL

## 2016-12-17 MED ORDER — ONDANSETRON HCL 4 MG/2ML IJ SOLN
INTRAMUSCULAR | Status: AC
  Filled 2016-12-17: qty 2

## 2016-12-17 MED ORDER — BUPIVACAINE-EPINEPHRINE (PF) 0.5% -1:200000 IJ SOLN
INTRAMUSCULAR | Status: DC | PRN
  Administered 2016-12-17: 15 mL

## 2016-12-17 MED ORDER — HYDROCHLOROTHIAZIDE 12.5 MG PO CAPS
12.5000 mg | ORAL_CAPSULE | Freq: Every day | ORAL | Status: DC
  Administered 2016-12-18: 12.5 mg via ORAL
  Filled 2016-12-17: qty 1

## 2016-12-17 MED ORDER — THROMBIN 5000 UNITS EX SOLR
CUTANEOUS | Status: AC
  Filled 2016-12-17: qty 10000

## 2016-12-17 MED ORDER — OXYCODONE HCL 5 MG PO TABS
5.0000 mg | ORAL_TABLET | ORAL | Status: DC | PRN
  Administered 2016-12-17: 5 mg via ORAL
  Filled 2016-12-17: qty 2

## 2016-12-17 MED ORDER — OXYCODONE HCL 5 MG PO TABS
5.0000 mg | ORAL_TABLET | ORAL | Status: DC | PRN
  Administered 2016-12-17: 10 mg via ORAL
  Filled 2016-12-17: qty 2

## 2016-12-17 MED ORDER — SODIUM CHLORIDE 0.45 % IV SOLN
INTRAVENOUS | Status: DC
  Administered 2016-12-17: 20:00:00 via INTRAVENOUS

## 2016-12-17 MED ORDER — ALUM & MAG HYDROXIDE-SIMETH 200-200-20 MG/5ML PO SUSP: 30.0000 mL | Freq: Four times a day (QID) | ORAL | Status: DC | PRN

## 2016-12-17 MED ORDER — ACETAMINOPHEN 650 MG RE SUPP
650.0000 mg | RECTAL | Status: DC | PRN
Start: 2016-12-17 — End: 2016-12-18

## 2016-12-17 MED ORDER — THROMBIN 5000 UNITS EX SOLR
OROMUCOSAL | Status: DC | PRN
  Administered 2016-12-17: 5 mL via TOPICAL

## 2016-12-17 MED ORDER — HYDROMORPHONE HCL 1 MG/ML IJ SOLN
INTRAMUSCULAR | Status: AC
  Administered 2016-12-17: 0.25 mg via INTRAVENOUS
  Filled 2016-12-17: qty 1

## 2016-12-17 MED ORDER — LOSARTAN POTASSIUM 50 MG PO TABS
100.0000 mg | ORAL_TABLET | Freq: Every day | ORAL | Status: DC
  Administered 2016-12-18: 100 mg via ORAL
  Filled 2016-12-17: qty 2

## 2016-12-17 MED ORDER — GELATIN ABSORBABLE MT POWD
OROMUCOSAL | Status: DC | PRN
  Administered 2016-12-17: 5 mL via TOPICAL

## 2016-12-17 MED ORDER — SODIUM CHLORIDE 0.9 % IV SOLN: 250.0000 mL | INTRAVENOUS | Status: DC

## 2016-12-17 MED ORDER — PHENYLEPHRINE 40 MCG/ML (10ML) SYRINGE FOR IV PUSH (FOR BLOOD PRESSURE SUPPORT)
PREFILLED_SYRINGE | INTRAVENOUS | Status: AC
  Filled 2016-12-17: qty 10

## 2016-12-17 MED ORDER — HYDROMORPHONE HCL 1 MG/ML IJ SOLN: 0.5000 mg | INTRAMUSCULAR | Status: DC | PRN

## 2016-12-17 MED ORDER — ACETAMINOPHEN 325 MG PO TABS: 650.0000 mg | ORAL_TABLET | ORAL | Status: DC | PRN

## 2016-12-17 MED ORDER — STERILE WATER FOR INJECTION IJ SOLN
INTRAMUSCULAR | Status: AC
  Filled 2016-12-17: qty 10

## 2016-12-17 MED ORDER — KCL IN DEXTROSE-NACL 20-5-0.45 MEQ/L-%-% IV SOLN
INTRAVENOUS | Status: DC
  Administered 2016-12-17: 50 mL/h via INTRAVENOUS
  Filled 2016-12-17 (×2): qty 1000

## 2016-12-17 MED ORDER — SODIUM CHLORIDE 0.9 % IR SOLN
Status: DC | PRN
  Administered 2016-12-17: 500 mL

## 2016-12-17 MED ORDER — LIDOCAINE 2% (20 MG/ML) 5 ML SYRINGE
INTRAMUSCULAR | Status: AC
  Filled 2016-12-17: qty 5

## 2016-12-17 MED ORDER — LACTATED RINGERS IV SOLN
INTRAVENOUS | Status: DC
  Administered 2016-12-17: 10:00:00 via INTRAVENOUS

## 2016-12-17 MED ORDER — ROCURONIUM BROMIDE 50 MG/5ML IV SOSY
PREFILLED_SYRINGE | INTRAVENOUS | Status: AC
  Filled 2016-12-17: qty 10

## 2016-12-17 MED ORDER — POLYETHYLENE GLYCOL 3350 17 G PO PACK: 17.0000 g | PACK | Freq: Every day | ORAL | 0 refills | Status: DC

## 2016-12-17 MED ORDER — SODIUM CHLORIDE 0.9% FLUSH
3.0000 mL | Freq: Two times a day (BID) | INTRAVENOUS | Status: DC
  Administered 2016-12-18: 3 mL via INTRAVENOUS

## 2016-12-17 MED ORDER — RISAQUAD PO CAPS
1.0000 | ORAL_CAPSULE | Freq: Every day | ORAL | Status: DC
  Administered 2016-12-18: 1 via ORAL
  Filled 2016-12-17: qty 1

## 2016-12-17 MED ORDER — PHENOL 1.4 % MT LIQD: 1.0000 | OROMUCOSAL | Status: DC | PRN

## 2016-12-17 MED ORDER — METHOCARBAMOL 1000 MG/10ML IJ SOLN
500.0000 mg | Freq: Four times a day (QID) | INTRAMUSCULAR | Status: DC | PRN
  Administered 2016-12-17: 500 mg via INTRAVENOUS
  Filled 2016-12-17: qty 550

## 2016-12-17 MED ORDER — DEXAMETHASONE SODIUM PHOSPHATE 10 MG/ML IJ SOLN
INTRAMUSCULAR | Status: AC
  Filled 2016-12-17: qty 1

## 2016-12-17 MED ORDER — OXYCODONE-ACETAMINOPHEN 5-325 MG PO TABS: 1.0000 | ORAL_TABLET | ORAL | 0 refills | Status: DC | PRN

## 2016-12-17 MED ORDER — DEXTROSE 5 % IV SOLN: 500.0000 mg | Freq: Four times a day (QID) | INTRAVENOUS | Status: DC | PRN

## 2016-12-17 MED ORDER — MORPHINE SULFATE (PF) 4 MG/ML IV SOLN: 1.0000 mg | INTRAVENOUS | Status: DC | PRN

## 2016-12-17 MED ORDER — DOCUSATE SODIUM 100 MG PO CAPS: 100.0000 mg | ORAL_CAPSULE | Freq: Two times a day (BID) | ORAL | 1 refills | Status: DC | PRN

## 2016-12-17 MED ORDER — DEXAMETHASONE SODIUM PHOSPHATE 4 MG/ML IJ SOLN
INTRAMUSCULAR | Status: DC | PRN
  Administered 2016-12-17: 10 mg via INTRAVENOUS

## 2016-12-17 MED ORDER — MIDAZOLAM HCL 5 MG/5ML IJ SOLN
INTRAMUSCULAR | Status: DC | PRN
  Administered 2016-12-17: 2 mg via INTRAVENOUS

## 2016-12-17 MED ORDER — ONDANSETRON HCL 4 MG/2ML IJ SOLN
INTRAMUSCULAR | Status: DC | PRN
  Administered 2016-12-17: 4 mg via INTRAVENOUS

## 2016-12-17 MED ORDER — SODIUM CHLORIDE 0.9 % IR SOLN
Status: AC
  Filled 2016-12-17: qty 500000

## 2016-12-17 MED ORDER — BUPIVACAINE-EPINEPHRINE (PF) 0.5% -1:200000 IJ SOLN
INTRAMUSCULAR | Status: AC
  Filled 2016-12-17: qty 30

## 2016-12-17 MED ORDER — DOCUSATE SODIUM 100 MG PO CAPS
100.0000 mg | ORAL_CAPSULE | Freq: Two times a day (BID) | ORAL | Status: DC
  Administered 2016-12-17 – 2016-12-18 (×2): 100 mg via ORAL
  Filled 2016-12-17 (×2): qty 1

## 2016-12-17 MED ORDER — CEFAZOLIN SODIUM-DEXTROSE 2-4 GM/100ML-% IV SOLN
2.0000 g | Freq: Three times a day (TID) | INTRAVENOUS | Status: AC
  Administered 2016-12-17 – 2016-12-18 (×2): 2 g via INTRAVENOUS
  Filled 2016-12-17 (×2): qty 100

## 2016-12-17 MED ORDER — CEFAZOLIN SODIUM-DEXTROSE 2-4 GM/100ML-% IV SOLN: 2.0000 g | Freq: Three times a day (TID) | INTRAVENOUS | Status: DC

## 2016-12-17 MED ORDER — SODIUM CHLORIDE 0.9% FLUSH: 3.0000 mL | INTRAVENOUS | Status: DC | PRN

## 2016-12-17 MED ORDER — SUGAMMADEX SODIUM 200 MG/2ML IV SOLN
INTRAVENOUS | Status: DC | PRN
  Administered 2016-12-17: 500 mg via INTRAVENOUS

## 2016-12-17 MED ORDER — PROMETHAZINE HCL 25 MG/ML IJ SOLN: 6.2500 mg | INTRAMUSCULAR | Status: DC | PRN

## 2016-12-17 MED ORDER — EPHEDRINE SULFATE-NACL 50-0.9 MG/10ML-% IV SOSY
PREFILLED_SYRINGE | INTRAVENOUS | Status: DC | PRN
  Administered 2016-12-17 (×2): 5 mg via INTRAVENOUS
  Administered 2016-12-17: 10 mg via INTRAVENOUS

## 2016-12-17 SURGICAL SUPPLY — 46 items
BAG ZIPLOCK 12X15 (MISCELLANEOUS) IMPLANT
CLEANER TIP ELECTROSURG 2X2 (MISCELLANEOUS) ×2 IMPLANT
CLOTH 2% CHLOROHEXIDINE 3PK (PERSONAL CARE ITEMS) ×2 IMPLANT
COVER SURGICAL LIGHT HANDLE (MISCELLANEOUS) ×2 IMPLANT
DRAPE MICROSCOPE LEICA (MISCELLANEOUS) ×2 IMPLANT
DRAPE SHEET LG 3/4 BI-LAMINATE (DRAPES) IMPLANT
DRAPE SURG 17X11 SM STRL (DRAPES) ×2 IMPLANT
DRAPE UTILITY XL STRL (DRAPES) ×2 IMPLANT
DRSG AQUACEL AG ADV 3.5X 4 (GAUZE/BANDAGES/DRESSINGS) ×2 IMPLANT
DURAPREP 26ML APPLICATOR (WOUND CARE) ×2 IMPLANT
DURASEAL SPINE SEALANT 3ML (MISCELLANEOUS) IMPLANT
ELECT BLADE TIP CTD 4 INCH (ELECTRODE) IMPLANT
ELECT REM PT RETURN 15FT ADLT (MISCELLANEOUS) ×2 IMPLANT
GLOVE BIOGEL M STRL SZ7.5 (GLOVE) ×2 IMPLANT
GLOVE BIOGEL PI IND STRL 7.5 (GLOVE) ×3 IMPLANT
GLOVE BIOGEL PI INDICATOR 7.5 (GLOVE) ×3
GLOVE SURG SS PI 8.0 STRL IVOR (GLOVE) ×4 IMPLANT
GOWN STRL REUS W/TWL XL LVL3 (GOWN DISPOSABLE) ×6 IMPLANT
IV CATH 14GX2 1/4 (CATHETERS) IMPLANT
KIT BASIN OR (CUSTOM PROCEDURE TRAY) ×2 IMPLANT
KIT POSITIONING SURG ANDREWS (MISCELLANEOUS) ×2 IMPLANT
MANIFOLD NEPTUNE II (INSTRUMENTS) ×2 IMPLANT
MARKER SKIN DUAL TIP RULER LAB (MISCELLANEOUS) ×2 IMPLANT
NEEDLE SPNL 18GX3.5 QUINCKE PK (NEEDLE) ×4 IMPLANT
PACK LAMINECTOMY ORTHO (CUSTOM PROCEDURE TRAY) ×2 IMPLANT
PATTIES SURGICAL .5 X.5 (GAUZE/BANDAGES/DRESSINGS) ×4 IMPLANT
PATTIES SURGICAL .75X.75 (GAUZE/BANDAGES/DRESSINGS) ×2 IMPLANT
PATTIES SURGICAL 1X1 (DISPOSABLE) IMPLANT
RUBBERBAND STERILE (MISCELLANEOUS) ×2 IMPLANT
SPONGE LAP 4X18 X RAY DECT (DISPOSABLE) IMPLANT
SPONGE SURGIFOAM ABS GEL 100 (HEMOSTASIS) ×2 IMPLANT
STAPLER VISISTAT (STAPLE) IMPLANT
STRIP CLOSURE SKIN 1/2X4 (GAUZE/BANDAGES/DRESSINGS) ×2 IMPLANT
SURGIFLO W/THROMBIN 8M KIT (HEMOSTASIS) ×2 IMPLANT
SUT NURALON 4 0 TR CR/8 (SUTURE) IMPLANT
SUT PROLENE 3 0 PS 2 (SUTURE) IMPLANT
SUT VIC AB 1 CT1 27 (SUTURE)
SUT VIC AB 1 CT1 27XBRD ANTBC (SUTURE) IMPLANT
SUT VIC AB 1-0 CT2 27 (SUTURE) IMPLANT
SUT VIC AB 2-0 CT1 27 (SUTURE)
SUT VIC AB 2-0 CT1 TAPERPNT 27 (SUTURE) IMPLANT
SUT VIC AB 2-0 CT2 27 (SUTURE) IMPLANT
SYR 3ML LL SCALE MARK (SYRINGE) ×2 IMPLANT
TOWEL OR 17X26 10 PK STRL BLUE (TOWEL DISPOSABLE) ×2 IMPLANT
TOWEL OR NON WOVEN STRL DISP B (DISPOSABLE) ×2 IMPLANT
YANKAUER SUCT BULB TIP NO VENT (SUCTIONS) ×2 IMPLANT

## 2016-12-17 NOTE — Anesthesia Preprocedure Evaluation (Signed)
Anesthesia Evaluation  Patient identified by MRN, date of birth, ID band Patient awake    Reviewed: Allergy & Precautions, NPO status , Patient's Chart, lab work & pertinent test results  Airway Mallampati: II  TM Distance: >3 FB Neck ROM: Full    Dental no notable dental hx.    Pulmonary neg pulmonary ROS,    Pulmonary exam normal breath sounds clear to auscultation       Cardiovascular hypertension, + CAD, + Past MI and + CABG  Normal cardiovascular exam Rhythm:Regular Rate:Normal     Neuro/Psych negative neurological ROS  negative psych ROS   GI/Hepatic negative GI ROS, Neg liver ROS,   Endo/Other  diabetes  Renal/GU negative Renal ROS  negative genitourinary   Musculoskeletal negative musculoskeletal ROS (+)   Abdominal   Peds negative pediatric ROS (+)  Hematology negative hematology ROS (+)   Anesthesia Other Findings   Reproductive/Obstetrics negative OB ROS                             Anesthesia Physical Anesthesia Plan  ASA: III  Anesthesia Plan: General   Post-op Pain Management:    Induction: Intravenous  Airway Management Planned: Oral ETT  Additional Equipment:   Intra-op Plan:   Post-operative Plan: Extubation in OR  Informed Consent: I have reviewed the patients History and Physical, chart, labs and discussed the procedure including the risks, benefits and alternatives for the proposed anesthesia with the patient or authorized representative who has indicated his/her understanding and acceptance.   Dental advisory given  Plan Discussed with: CRNA and Surgeon  Anesthesia Plan Comments:         Anesthesia Quick Evaluation

## 2016-12-17 NOTE — Anesthesia Procedure Notes (Signed)
Procedure Name: Intubation Date/Time: 12/17/2016 11:30 AM Performed by: Deliah Boston Pre-anesthesia Checklist: Patient identified, Emergency Drugs available, Suction available and Patient being monitored Patient Re-evaluated:Patient Re-evaluated prior to inductionOxygen Delivery Method: Circle system utilized Preoxygenation: Pre-oxygenation with 100% oxygen Intubation Type: IV induction Ventilation: Mask ventilation without difficulty Laryngoscope Size: Mac and 4 Grade View: Grade I Tube type: Oral Number of attempts: 1 Airway Equipment and Method: Stylet and Oral airway Placement Confirmation: ETT inserted through vocal cords under direct vision,  positive ETCO2 and breath sounds checked- equal and bilateral Secured at: 21 (at lip) cm Tube secured with: Tape Dental Injury: Teeth and Oropharynx as per pre-operative assessment

## 2016-12-17 NOTE — Transfer of Care (Signed)
.  Immediate Anesthesia Transfer of Care Note  Patient: Adrian [name]  Procedure(s) Performed: Procedure(s) with comments: Microlumbar decompression L2-L3 (Left) - Requests for 2 hrs  Patient Location: PACU  Anesthesia Type:General  Level of Consciousness: Patient easily awoken, sedated, comfortable, cooperative, following commands, responds to stimulation.   Airway & Oxygen Therapy: Patient spontaneously breathing, ventilating well, oxygen via simple oxygen mask.  Post-op Assessment: Report given to PACU RN, vital signs reviewed and stable, moving all extremities.   Post vital signs: Reviewed and stable.  Complications: No apparent anesthesia complications Last Vitals:  Vitals:   12/17/16 0930  BP: 140/72  Pulse: 62  Resp: 18  Temp: 36.4 C    Last Pain:  Vitals:   12/17/16 1006  TempSrc:   PainSc: 0-No pain      Patients Stated Pain Goal: 4 (12/17/16 1006)  Complications: No apparent anesthesia complications

## 2016-12-17 NOTE — H&P (View-Only) (Signed)
Adrian [name] is an 66 y.o. male.   Chief Complaint: back and leg pain HPI: The patient is a 66 year old male who presents today for follow up of their back. The patient is being followed for their left-sided back pain. They are now 15 day(s) out from when symptoms began. Symptoms reported today include: pain and leg pain (LLE). Current treatment includes: activity modification and Gabapentin and Robaxin. The patient reports their current pain level to be 6 / 10. The patient presents today following MRI.  Adrian [name] [facility] follows up with his MRI. I called him at home. 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 He reports no changes, severe pain down to his left anterior thigh. He has difficulty straightening the leg out or flexing his hip up. He is here with his wife.  REVIEW OF SYSTEMS Review of systems is negative for fevers, chest pain, shortness of breath, unexplained recent weight loss, loss of bowel or bladder function, burning with urination, joint swelling, rashes, weakness or numbness, difficulty with balance, easy bruising, excessive thirst or frequent urination.  Past Medical History:  Diagnosis Date  . CAD (coronary artery disease)   . Chest wall pain following surgery   . Diabetes mellitus without complication (HCC)   . Hyperlipidemia   . Hypertension   . Pleural effusion, left     Past Surgical History:  Procedure Laterality Date  . CARDIAC CATHETERIZATION  04/29/10   LEFT MAIN-FAIRLY SHORT VESSEL, FREE OF DISEASE. LAD 70-90%. A CERTAIN SEGMENT APPEARS SLIGHTLY ULCERATED, THERE MAYBE ACTIVE RUPTURED PLAQUE. RAMUS INTERMEDIUS 95% IN THE PROXIMAL THIRD. LEFT CX 70-80% STENOSIS IN THE MID OM. RCA 40-50% OSTIAL STENOSIS. REFERRED FOR CABG.  . CAROTID DUPLEX Bilateral 04/29/10   NO ICA STENOSIS BIL.+  . CORONARY ARTERY BYPASS GRAFT  04/30/10   X4  . Coronary artery bypass  grafting  05/01/2010  . lumbar laminectomy in 2006 by Dr. Pennie Banter    . NUCLEAR STRESS TEST N/A 03/01/10   NORMAL PERFUSION PATTERN IN ALL REGIONS. EF 69%.NL MYOCARDIAL PERFUSION STUDY.  . TRANSTHORACIC ECHOCARDIOGRAM N/A 04/27/10   LV SIZE IS NORMAL. SYSTOLIC FUNC IS NORMAL.    Social History:  reports that he has never smoked. He has never used smokeless tobacco. He reports that he drinks alcohol. He reports that he does not use drugs.  Allergies: No Known Allergies  Family History Cancer  Father. Heart Disease  Mother. Congestive Heart Failure  Mother. First Degree Relatives  reported Hypertension  Mother. Heart disease in male family member before age 34   Medication History  Robaxin (  Tablet, 1 (one) Tablet Oral every eight hours, as needed, Taken starting 10/31/2016) Active. Neurontin (  Capsule, 1 (one) Capsule Oral 1 qhs x 3days, 1 bid x 3days, then tid, Taken starting 10/31/2016) Active. (1 qhs x 3 days, 1 bid x 3days, then tid) PredniSONE (  Tablet, Oral) Active. Oxycodone-Acetaminophen (5-325MG  Tablet, Oral) Active. Lipitor (  Tablet, Oral) Active. Losartan Potassium (  Tablet, Oral) Active. Hydrochlorothiazide (12.5MG  Tablet, Oral) Active. Bayer Aspirin EC Low Dose (  Tablet DR, Oral) Active. Medications Reconciled  Review of Systems  Constitutional: Negative.   HENT: Negative.   Eyes: Negative.   Respiratory: Negative.   Cardiovascular: Negative.   Gastrointestinal: Negative.   Genitourinary: Negative.   Musculoskeletal: Positive for back pain.  Skin: Negative.   Neurological: Positive for sensory change and focal weakness.  There were no vitals taken for this visit. Physical Exam  Constitutional: He is oriented to person, place, and time. He appears well-developed. He appears distressed.  HENT:  Head: Normocephalic.  Eyes: Pupils are equal, round, and reactive to light.  Neck: Normal range of motion.   Cardiovascular: Normal rate.   Respiratory: Effort normal.  GI: Soft.  Musculoskeletal:  He is upright, in moderate distress. Mood and affect is appropriate. He has 3/5 hip flexor weakness as well as 4/5 quad weakness and tibialis anterior. Altered sensation in the L4 dermatome. Absent knee reflex. Pain with extension. Lumbar spine exam reveals no evidence of soft tissue swelling, deformity or skin ecchymosis. On palpation there is no tenderness of the lumbar spine. No flank pain with percussion. The abdomen is soft and nontender. Nontender over the trochanters. No cellulitis or lymphadenopathy.  Motor is 5/5 including EHL and hamstrings. There is no Babinski or clonus. Patient has good distal pulses. No DVT. No pain and normal range of motion without instability of the hips, knees and ankles.  Painless range of motion of the cervical spine.  Neurological: He is alert and oriented to person, place, and time.  Skin: Skin is warm and dry.    MRI was reviewed with the patient in detail indicating compression of the L3-4 root by an extruded fragment.   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  Given the duration of his symptoms and the presence of neurologic deficit and neural compressive lesion, we discussed decompression to remove the herniated fragment. It would require probably decompression at L2-3, perhaps to extend down to L3-4 to retrieve the extruded fragment.  I had an extensive discussion of the risks and benefits of the lumbar decompression with the patient including bleeding, infection, damage to neurovascular structures, epidural fibrosis, CSF leak requiring repair. We also discussed increase in pain, adjacent segment disease, recurrent disc herniation, need for future surgery including repeat decompression and/or fusion. We also discussed risks of  postoperative hematoma, paralysis, anesthetic complications including DVT, PE, death, cardiopulmonary dysfunction. In addition, the perioperative and postoperative courses were discussed in detail including the rehabilitative time and return to functional activity and work. I provided the patient with an illustrated handout and utilized the appropriate surgical models.  We discussed at length the procedure in detail using a PowerPoint presentation and the model and spent considerable time discussing all of these issues. We discussed preoperative course and postoperative course in detail. We obtained kindly preoperative clearance. Use a cane and analgesics. Any definitive changes in the interim, he is to call. We will proceed as soon as we obtain clearance.  Plan microlumbar decompression L2-3, possible L3-4  Tyquavious Gamel, Dayna Barker., PA-C for Dr. Shelle Iron 12/01/2016, 8:38 AM

## 2016-12-17 NOTE — Interval H&P Note (Signed)
History and Physical Interval Note:  12/17/2016 10:46 AM  Adrian [name]  has presented today for surgery, with the diagnosis of HNP L2-L4  The various methods of treatment have been discussed with the patient and family. After consideration of risks, benefits and other options for treatment, the patient has consented to  Procedure(s) with comments: Microlumbar decompression L2-3, possible L3-4 (N/A) - Requests for 2 hrs as a surgical intervention .  The patient's history has been reviewed, patient examined, no change in status, stable for surgery.  I have reviewed the patient's chart and labs.  Questions were answered to the patient's satisfaction.     Kynleigh Artz C

## 2016-12-17 NOTE — Brief Op Note (Signed)
12/17/2016  1:16 PM  PATIENT:  Adrian [name]  66 y.o. male  PRE-OPERATIVE DIAGNOSIS:  HNP L2-L4  POST-OPERATIVE DIAGNOSIS:  HNP L2-L4  PROCEDURE:  Procedure(s) with comments: Microlumbar decompression L2-L3 (N/A) - Requests for 2 hrs  SURGEON:  Surgeon(s) and Role:    * Jene Every, MD - Primary  PHYSICIAN ASSISTANT:   ASSISTANTSSu Hilt    ANESTHESIA:   general  EBL:  Total I/O In: -  Out: 125 [Blood:125]  BLOOD ADMINISTERED:none  DRAINS: none   LOCAL MEDICATIONS USED:  MARCAINE     SPECIMEN:  No Specimen  DISPOSITION OF SPECIMEN:  N/A  COUNTS:  YES  TOURNIQUET:  * No tourniquets in log *  DICTATION: .Other Dictation: Dictation Number [id number]  PLAN OF CARE: Admit for overnight observation  PATIENT DISPOSITION:  PACU - hemodynamically stable.   Delay start of Pharmacological VTE agent (>24hrs) due to surgical blood loss or risk of bleeding: yes

## 2016-12-17 NOTE — Discharge Instructions (Signed)
Walk As Tolerated utilizing back precautions.  No bending, twisting, or lifting.  No driving for 2 weeks.   °Aquacel dressing may remain in place until follow up. May shower with aquacel dressing in place. If the dressing peels off or becomes saturated, you may remove aquacel dressing and place gauze and tape dressing which should be kept clean and dry and changed daily. Do not remove steri-strips if they are present. °See Dr. Belina Mandile in office in 10 to 14 days. Begin taking aspirin 81mg per day starting 4 days after your surgery if not allergic to aspirin or on another blood thinner. °Walk daily even outside. Use a cane or walker only if necessary. °Avoid sitting on soft sofas. ° °

## 2016-12-18 ENCOUNTER — Encounter (HOSPITAL_COMMUNITY): Payer: Self-pay | Admitting: Specialist

## 2016-12-18 DIAGNOSIS — M5116 Intervertebral disc disorders with radiculopathy, lumbar region: Secondary | ICD-10-CM | POA: Diagnosis not present

## 2016-12-18 DIAGNOSIS — Z951 Presence of aortocoronary bypass graft: Secondary | ICD-10-CM | POA: Diagnosis not present

## 2016-12-18 DIAGNOSIS — Z809 Family history of malignant neoplasm, unspecified: Secondary | ICD-10-CM | POA: Diagnosis not present

## 2016-12-18 DIAGNOSIS — E119 Type 2 diabetes mellitus without complications: Secondary | ICD-10-CM | POA: Diagnosis not present

## 2016-12-18 DIAGNOSIS — I252 Old myocardial infarction: Secondary | ICD-10-CM | POA: Diagnosis not present

## 2016-12-18 DIAGNOSIS — E785 Hyperlipidemia, unspecified: Secondary | ICD-10-CM | POA: Diagnosis not present

## 2016-12-18 DIAGNOSIS — I878 Other specified disorders of veins: Secondary | ICD-10-CM | POA: Diagnosis not present

## 2016-12-18 DIAGNOSIS — I2510 Atherosclerotic heart disease of native coronary artery without angina pectoris: Secondary | ICD-10-CM | POA: Diagnosis not present

## 2016-12-18 DIAGNOSIS — I10 Essential (primary) hypertension: Secondary | ICD-10-CM | POA: Diagnosis not present

## 2016-12-18 DIAGNOSIS — M48061 Spinal stenosis, lumbar region without neurogenic claudication: Secondary | ICD-10-CM | POA: Diagnosis not present

## 2016-12-18 LAB — CBC
HCT: 39.3 % (ref 39.0–52.0)
Hemoglobin: 13.3 g/dL (ref 13.0–17.0)
MCH: 28.6 pg (ref 26.0–34.0)
MCHC: 33.8 g/dL (ref 30.0–36.0)
MCV: 84.5 fL (ref 78.0–100.0)
Platelets: 204 10*3/uL (ref 150–400)
RBC: 4.65 MIL/uL (ref 4.22–5.81)
RDW: 13.1 % (ref 11.5–15.5)
WBC: 16.2 10*3/uL — AB (ref 4.0–10.5)

## 2016-12-18 LAB — BASIC METABOLIC PANEL
Anion gap: 9 (ref 5–15)
BUN: 21 mg/dL — AB (ref 6–20)
CHLORIDE: 103 mmol/L (ref 101–111)
CO2: 26 mmol/L (ref 22–32)
CREATININE: 1.13 mg/dL (ref 0.61–1.24)
Calcium: 9 mg/dL (ref 8.9–10.3)
GFR calc Af Amer: >60 mL/min (ref >60)
GFR calc non Af Amer: >60 mL/min (ref >60)
Glucose, Bld: 162 mg/dL — ABNORMAL HIGH (ref 65–99)
Potassium: 4.2 mmol/L (ref 3.5–5.1)
Sodium: 138 mmol/L (ref 135–145)

## 2016-12-18 NOTE — Progress Notes (Signed)
    Durable Medical Equipment        Start     Ordered   12/18/16 [phone]  For home use only DME 3 n 1  Once     12/18/16 [phone]   12/18/16 0909  For home use only DME Walker rolling  Once    Question:  Patient needs a walker to treat with the following condition  Answer:  Surgery, elective   12/18/16 [id number]    [phone]

## 2016-12-18 NOTE — Progress Notes (Signed)
Subjective: 1 Day Post-Op Procedure(s) (LRB): Microlumbar decompression L2-L3 (Left) Patient reports pain as 4 on 0-10 scale.    Objective: Vital signs in last 24 hours: Temp:  [97.4 F (36.3 C)-97.8 F (36.6 C)] 97.8 F (36.6 C) (05/03 0547) Pulse Rate:  [60-96] 96 (05/03 0547) Resp:  [15-18] 16 (05/03 0547) BP: (116-140)/(53-72) 118/60 (05/03 0547) SpO2:  [93 %-100 %] 100 % (05/03 0547) Weight:  [112.9 kg (249 lb)] 112.9 kg (249 lb) (05/02 1450)  Intake/Output from previous day: 05/02 0701 - 05/03 0700 In: 2180 [P.O.:700; I.V.:1225; IV Piggyback:255] Out: 1025 [Urine:900; Blood:125] Intake/Output this shift: No intake/output data recorded.   Recent Labs  12/18/16 0530  HGB 13.3    Recent Labs  12/18/16 0530  WBC 16.2*  RBC 4.65  HCT 39.3  PLT 204    Recent Labs  12/18/16 0530  NA 138  K 4.2  CL 103  CO2 26  BUN 21*  CREATININE 1.13  GLUCOSE 162*  CALCIUM 9.0   No results for input(s): LABPT, INR in the last 72 hours.  Neurologically intact Neurovascular intact Dorsiflexion/Plantar flexion intact Incision: scant drainage Better strength.  Assessment/Plan: 1 Day Post-Op Procedure(s) (LRB): Microlumbar decompression L2-L3 (Left) Advance diet Up with therapy D/C IV fluids Discharge home with home health  instr given.  Braeson Rupe C 12/18/2016, 7:19 AM

## 2016-12-18 NOTE — Evaluation (Signed)
Physical Therapy Evaluation Patient Details Name: Adrian [name] MRN: [id number] DOB: 02/20/1951 Today's Date: 12/18/2016   History of Present Illness  Pt s/p L2-3 micro-lumbar decompression and with hx of MI, CAD, DM, and CABG  Clinical Impression  Pt s/p back surgery and presents with post op pain and back precautions limiting functional mobility.  Pt educated on back precautions and basic mobility techniques and is currently mobilizing at supervision level.  Pt plans dc home with family assist.    Follow Up Recommendations No PT follow up    Equipment Recommendations  Rolling walker with 5" wheels    Recommendations for Other Services OT consult     Precautions / Restrictions Precautions Precautions: Back Precaution Booklet Issued: Yes (comment) Precaution Comments: Reviewed back precautions x 2 Restrictions Weight Bearing Restrictions: No      Mobility  Bed Mobility Overal bed mobility: Needs Assistance Bed Mobility: Supine to Sit     Supine to sit: Min guard;Supervision     General bed mobility comments: multimodal cues for correct log roll technique and transition to sitting  Transfers Overall transfer level: Needs assistance   Transfers: Sit to/from Stand Sit to Stand: Min guard         General transfer comment: cues for transition position, adherence to back precautions and use of UEs to self assist  Ambulation/Gait Ambulation/Gait assistance: Min guard;Supervision Ambulation Distance (Feet): 400 Feet Assistive device: Rolling walker (2 wheeled);None Gait Pattern/deviations: Step-through pattern;Decreased step length - right;Decreased step length - left;Shuffle;Wide base of support Gait velocity: decr Gait velocity interpretation: Below normal speed for age/gender General Gait Details: 17100' with RW and 300' sans AD but pt noted to be more tentative and cautious  Stairs Stairs: Yes Stairs assistance: Min guard Stair Management: Two rails;Step to  pattern;Forwards Number of Stairs: 5 General stair comments: cues for sequence and foot placement  Wheelchair Mobility    Modified Rankin (Stroke Patients Only)       Balance Overall balance assessment: No apparent balance deficits (not formally assessed)                                           Pertinent Vitals/Pain Pain Assessment: 0-10 Pain Score: 2  Pain Location: back Pain Descriptors / Indicators: Aching;Sore Pain Intervention(s): Limited activity within patient's tolerance;Monitored during session;Premedicated before session    Home Living Family/patient expects to be discharged to:: Private residence Living Arrangements: Spouse/significant other Available Help at Discharge: Family Type of Home: House Home Access: Stairs to enter Entrance Stairs-Rails: Right;Left;Can reach both Secretary/administratorntrance Stairs-Number of Steps: 3 Home Layout: One level Home Equipment: Cane - single point      Prior Function Level of Independence: Independent;Independent with assistive device(s)               Hand Dominance        Extremity/Trunk Assessment   Upper Extremity Assessment Upper Extremity Assessment: Overall WFL for tasks assessed    Lower Extremity Assessment Lower Extremity Assessment: Overall WFL for tasks assessed    Cervical / Trunk Assessment Cervical / Trunk Assessment: Normal  Communication   Communication: No difficulties  Cognition Arousal/Alertness: Awake/alert Behavior During Therapy: WFL for tasks assessed/performed Overall Cognitive Status: Within Functional Limits for tasks assessed  General Comments      Exercises     Assessment/Plan    PT Assessment Patent does not need any further PT services  PT Problem List Decreased activity tolerance;Decreased mobility;Decreased balance;Decreased knowledge of use of DME;Pain;Obesity       PT Treatment Interventions DME  instruction;Gait training;Stair training;Functional mobility training;Therapeutic activities;Patient/family education    PT Goals (Current goals can be found in the Care Plan section)  Acute Rehab PT Goals Patient Stated Goal: Regain IND and move without pain PT Goal Formulation: All assessment and education complete, DC therapy    Frequency Min 1X/week   Barriers to discharge        Co-evaluation               AM-PAC PT "6 Clicks" Daily Activity  Outcome Measure Difficulty turning over in bed (including adjusting bedclothes, sheets and blankets)?: A Little Difficulty moving from lying on back to sitting on the side of the bed? : A Little Difficulty sitting down on and standing up from a chair with arms (e.g., wheelchair, bedside commode, etc,.)?: A Little Help needed moving to and from a bed to chair (including a wheelchair)?: A Little Help needed walking in hospital room?: A Little Help needed climbing 3-5 steps with a railing? : A Little 6 Click Score: 18    End of Session   Activity Tolerance: Patient tolerated treatment well Patient left: in chair;with call bell/phone within reach;with family/visitor present Nurse Communication: Mobility status PT Visit Diagnosis: Unsteadiness on feet (R26.81);Difficulty in walking, not elsewhere classified (R26.2)    Time: 1610-9604 PT Time Calculation (min) (ACUTE ONLY): 30 min   Charges:   PT Evaluation $PT Eval Low Complexity: 1 Procedure PT Treatments $Gait Training: 8-22 mins   PT G Codes:        Pg [phone]   Trustin Chapa 12/18/2016, 11:53 AM

## 2016-12-18 NOTE — Evaluation (Signed)
Occupational Therapy Evaluation Patient Details Name: SEHAJ MCENROE MRN: [id number] DOB: 1951/03/27 Today's Date: 12/18/2016    History of Present Illness Pt s/p L2-3 micro-lumbar decompression and with hx of MI, CAD, DM, and CABG   Clinical Impression   Pt is a 66 y/o M who presents with the above. Pt requires MinGuard assist for most ADLs, with increased assist needed for LB dressing and LB bathing secondary to adhering to back precautions. Pt's spouse will be home to assist with ADLs PRN after discharge. Educated Pt and spouse throughout session on AE, DME, and compensatory techniques for safely completing ADLs and functional mobility while adhering to back precautions. Questions answered throughout. No further OT needs identified at this time. Will sign off.     Follow Up Recommendations  No OT follow up;Supervision/Assistance - 24 hour (initially)    Equipment Recommendations  3 in 1 bedside commode           Precautions / Restrictions Precautions Precautions: Back Precaution Booklet Issued: Yes (comment) (Issued by PT ) Precaution Comments: reviewed back precautions throughout session  Restrictions Weight Bearing Restrictions: No      Mobility Bed Mobility Overal bed mobility: Needs Assistance Bed Mobility: Supine to Sit;Sit to Supine     Supine to sit: Min guard;Supervision Sit to supine: Min guard;Supervision   General bed mobility comments: completed x2 times; Min verbal cues for completing correct log roll technique to transition to/from sitting   Transfers Overall transfer level: Needs assistance   Transfers: Sit to/from Stand Sit to Stand: Min guard         General transfer comment: cues for transition position, adherence to back precautions and use of UEs to self assist    Balance Overall balance assessment: No apparent balance deficits (not formally assessed)                                         ADL either performed or  assessed with clinical judgement   ADL Overall ADL's : Needs assistance/impaired Eating/Feeding: Independent;Sitting   Grooming: Oral care;Min guard;Cueing for sequencing;Standing Grooming Details (indicate cue type and reason): verbal cues for adhering to back precautions during task completion  Upper Body Bathing: Min guard;Sitting   Lower Body Bathing: Minimal assistance;Sit to/from stand;Adhering to back precautions   Upper Body Dressing : Set up;Sitting   Lower Body Dressing: Moderate assistance;Sit to/from stand;Adhering to back precautions   Toilet Transfer: Min guard;Ambulation;BSC Toilet Transfer Details (indicate cue type and reason): BSC over toilet  Toileting- Clothing Manipulation and Hygiene: Min guard;Sit to/from stand;Adhering to back precautions   Tub/ Shower Transfer: Walk-in shower;Min guard;Cueing for safety;Adhering to back precautions;Ambulation;3 in 1   Functional mobility during ADLs: Min guard General ADL Comments: completed bed mobility, room level functional mobility, toilet transfer, shower transfer, standing grooming ADLs; educated on AE and compensatory techniques for completing ADLs while adhering to back precautions throughout session                         Pertinent Vitals/Pain Pain Assessment: Faces Pain Score: 2  Faces Pain Scale: Hurts a little bit Pain Location: back Pain Descriptors / Indicators: Aching;Sore Pain Intervention(s): Limited activity within patient's tolerance;Monitored during session;Premedicated before session          Extremity/Trunk Assessment Upper Extremity Assessment Upper Extremity Assessment: Overall WFL for tasks assessed   Lower Extremity  Assessment Lower Extremity Assessment: Overall WFL for tasks assessed   Cervical / Trunk Assessment Cervical / Trunk Assessment: Normal   Communication Communication Communication: No difficulties   Cognition Arousal/Alertness: Awake/alert Behavior During  Therapy: WFL for tasks assessed/performed Overall Cognitive Status: Within Functional Limits for tasks assessed                                     General Comments                  Home Living Family/patient expects to be discharged to:: Private residence Living Arrangements: Spouse/significant other Available Help at Discharge: Family Type of Home: House Home Access: Stairs to enter Secretary/administratorntrance Stairs-Number of Steps: 3 Entrance Stairs-Rails: Right;Left;Can reach both Home Layout: One level     Bathroom Shower/Tub: Producer, television/film/videoWalk-in shower   Bathroom Toilet: Standard     Home Equipment: Cane - single point          Prior Functioning/Environment Level of Independence: Independent;Independent with assistive device(s)                 OT Problem List: Decreased activity tolerance;Decreased knowledge of precautions            OT Goals(Current goals can be found in the care plan section) Acute Rehab OT Goals Patient Stated Goal: Regain IND and move without pain OT Goal Formulation: With patient                                 AM-PAC PT "6 Clicks" Daily Activity     Outcome Measure Help from another person eating meals?: None Help from another person taking care of personal grooming?: A Little Help from another person toileting, which includes using toliet, bedpan, or urinal?: A Little Help from another person bathing (including washing, rinsing, drying)?: A Little Help from another person to put on and taking off regular upper body clothing?: None Help from another person to put on and taking off regular lower body clothing?: A Lot 6 Click Score: 19   End of Session Equipment Utilized During Treatment: Gait belt Nurse Communication: Mobility status;Other (comment) (DME needs )  Activity Tolerance: Patient tolerated treatment well Patient left: in chair;with call bell/phone within reach;with nursing/sitter in room  OT Visit Diagnosis:  Muscle weakness (generalized) (M62.81)                Time: 3244-01020927-0950 OT Time Calculation (min): 23 min Charges:  OT General Charges $OT Visit: 1 Procedure OT Evaluation $OT Eval Low Complexity: 1 Procedure G-Codes: OT G-codes **NOT FOR INPATIENT CLASS** Functional Assessment Tool Used: AM-PAC 6 Clicks Daily Activity;Clinical judgement Functional Limitation: Self care Self Care Current Status (V2536(G8987): At least 20 percent but less than 40 percent impaired, limited or restricted Self Care Goal Status (U4403(G8988): At least 20 percent but less than 40 percent impaired, limited or restricted Self Care Discharge Status [id number](G8989): At least 20 percent but less than 40 percent impaired, limited or restricted   Marcy SirenBreanna Shayaan Parke, OT Pager [phone] 12/18/2016   Orlando PennerBreanna L Blessing Ozga 12/18/2016, 12:15 PM

## 2016-12-18 NOTE — Op Note (Signed)
NAME:  Adrian [name], Adrian [name]                     ACCOUNT NO.:  MEDICAL RECORD NO.:  [id number]  LOCATION:                                 FACILITY:  PHYSICIAN:  Jene Every, M.D.    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  PROCEDURES PERFORMED: 1. Central decompression at L2-3 with bilateral L3 foraminotomies and     hemilaminotomies. 2. Lysis of epidural venous plexus.  ANESTHESIA:  General.  ASSISTANT:  Skip Mayer, PA.  HISTORY:  A 66 year old with left lower extremity radicular pain secondary to extruded fragment at L2-3 extending caudad.  He had L3 nerve root compression.  Myotomal weakness, dermatomal dysesthesias.  He also then developed right leg pain.  He did have multifactorial stenosis at 2-3, one caudad, history of lumbar decompression at 4-5.  We discussed microlumbar decompression at 2-3 to retrieve the fragment. Again, preoperative hip flexor and weakness were noted.  It was felt that we entered the interlaminar window at 2-3 and no caudad to the left as the fragment was below the 3 pedicle.  We discussed the risks and benefits including bleeding, infection, damage to the neurovascular structures, no change in symptoms, worsening symptoms, DVT, PE, anesthetic complications, etc.  TECHNIQUE:  With the patient in supine position after induction of adequate general anesthesia, 2 g of Kefzol, placed prone on the Canova frame.  All bony prominences were well padded.  Lumbar region was prepped and draped in usual sterile fashion.  Two 18-gauge spinal needles were utilized to localize 2-3 interspace, confirmed with x-ray. Incision was made from spinous process  of 2-3.  Subcutaneous tissue was dissected.  Electrocautery was utilized to achieve hemostasis.  A 0.25% Marcaine with epinephrine was infiltrated in the perimuscular tissues. We divided the dorsolumbar fascia in line with skin incision. Paraspinous muscles were elevated from lamina of 2-3.  McCullough retractor was placed, operating microscope was draped, brought on the surgical field.  Confirmatory radiograph obtained at 2-3.  We used a Leksell rongeur to remove the partial of the spinous processes of 2 and 3 as well as the interlaminar window at 2-3.  I detached the ligamentum flavum from the cephalad edge of 3 utilizing a straight microcurette and the caudad edge of 2.  We performed hemilaminotomies bilaterally at 2 to the point to detaching the ligamentum flavum.  Neuro patty placed beneath the ligamentum flavum.  I decompressed the lateral recesses by removing the ligamentum flavum from the interspaces bilaterally.  We protected the neural elements with a Woodson retractor.  We decompressed the lateral recess at the medial border of the pedicle bilaterally. Hemilaminotomies and foraminotomies of 3 were performed bilaterally to extend the disk space at 2-3 below the pedicle of 3 that was seen on the MRI.  There was ligamentum flavum and facet hypertrophy generating lateral recess stenosis bilaterally.  Following this, I was able to access the lateral aspect  of the thecal sac and the 3 root, gently mobilizing it medially.  There was extensive and large epidural venous plexus noted here onto the lateral recess and into the pedicle.  I then meticulously swept just beneath that was carefully with a micro nerve hook and a Woodson retractor.  Small fragments of disk were evacuated. The remaining mass was an extensive epidural venous plexus.  The patient had generalized bleeding throughout the case.  We controlled that with optimizing his blood pressure, bone wax, thrombin-soaked Gelfoam  and FloSeal.  Following decompression, the foraminotomy of all the 3 root at the foramen and a Woodson retractor passed freely at the foramen of 2, 3 and below the pedicle of 3 without neural compression.  I was felt then that I believe this epidural venous plexus in situ as to avoid extensive epidural bleeding.  No further disk material was amenable to retrieving. I felt this was again mainly residual epidural venous plexus and fibrous tissue.  Copiously irrigated with antibiotic irrigation.  Thrombin- soaked Gelfoam had been used to achieve hemostasis, I constituted FloSeal, injected it in the peri-decompression tissue, then evacuated the majority of that bone wax on the cancellous surfaces.  We removed the McCullough retractor after confirmatory radiograph obtained showing the disk space at 2-3 and caudad to the pedicle.  There was no disk herniation, retracted back to the disk space.  Copiously irrigated the paraspinous musculature, no active bleeding, closed the dorsolumbar fascia with 1 Vicryl, subcu with 2-0 and skin with Prolene.  Sterile dressing applied, placed supine on the hospital bed, extubated without difficulty and transported to the recovery room in satisfactory condition.  The patient tolerated the procedure well.  No complications.  Assistant, Skip MayerBlair Roberts, PA.  Blood loss, 125 mL.     Jene EveryJeffrey Laurren Lepkowski, M.D.     Cordelia PenJB/MEDQ  D:  12/17/2016  T:  12/17/2016  Job:  [id number]

## 2016-12-18 NOTE — Anesthesia Postprocedure Evaluation (Signed)
Anesthesia Post Note  Patient: Adrian [name]  Procedure(s) Performed: Procedure(s) (LRB): Microlumbar decompression L2-L3 (Left)  Patient location during evaluation: PACU Anesthesia Type: General Level of consciousness: awake and alert Pain management: pain level controlled Vital Signs Assessment: post-procedure vital signs reviewed and stable Respiratory status: spontaneous breathing, nonlabored ventilation, respiratory function stable and patient connected to nasal cannula oxygen Cardiovascular status: blood pressure returned to baseline and stable Postop Assessment: no signs of nausea or vomiting Anesthetic complications: no       Last Vitals:  Vitals:   12/18/16 0547 12/18/16 0855  BP: 118/60 (!) 136/54  Pulse: 96 62  Resp: 16 16  Temp: 36.6 C 36.9 C    Last Pain:  Vitals:   12/18/16 0857  TempSrc:   PainSc: 4                  Rosaleen Mazer S

## 2016-12-21 NOTE — Progress Notes (Signed)
   12/18/16 1100  PT Time Calculation  PT Start Time (ACUTE ONLY) 0800  PT Stop Time (ACUTE ONLY) 0830  PT Time Calculation (min) (ACUTE ONLY) 30 min  PT G-Codes **NOT FOR INPATIENT CLASS**  Functional Assessment Tool Used Clinical judgement  Functional Limitation Mobility: Walking and moving around  Mobility: Walking and Moving Around Current Status (Z6109(G8978) CI  Mobility: Walking and Moving Around Goal Status (U0454(G8979) CI  Mobility: Walking and Moving Around Discharge Status (U9811(G8980) CI  PT General Charges  $$ ACUTE PT VISIT 1 Procedure  PT Evaluation  $PT Eval Low Complexity 1 Procedure  PT Treatments  $Gait Training 8-22 mins

## 2016-12-25 NOTE — Discharge Summary (Signed)
Patient ID: Adrian [name] MRN: [id number] DOB/AGE: March 17, 1951 66 y.o.  Admit date: 12/17/2016 Discharge date: 12/25/2016  Admission Diagnoses:  Active Problems:   Spinal stenosis at L4-L5 level   Lumbar disc herniation with radiculopathy   Discharge Diagnoses:  Same  Past Medical History:  Diagnosis Date  . CAD (coronary artery disease)   . Chest wall pain following surgery   . Diabetes mellitus without complication (HCC)    pt states that he is not a diabetic; and does not take any diabetes medication  . Dyspnea    went to ED for SOB in january  . Hyperlipidemia   . Hypertension   . Myocardial infarction (HCC)    2011  . Pleural effusion, left     Surgeries: Procedure(s): Microlumbar decompression L2-L3 on 12/17/2016   Consultants:   Discharged Condition: Improved  Hospital Course: Adrian [name] is an 66 y.o. male who was admitted 12/17/2016 for operative treatment of<principal problem not specified>. Patient has severe unremitting pain that affects sleep, daily activities, and work/hobbies. After pre-op clearance the patient was taken to the operating room on 12/17/2016 and underwent  Procedure(s): Microlumbar decompression L2-L3.    Patient was given perioperative antibiotics:  Anti-infectives    Start     Dose/Rate Route Frequency Ordered Stop   12/17/16 2000  ceFAZolin (ANCEF) IVPB 2g/100 mL premix     2 g 200 mL/hr over 30 Minutes Intravenous Every 8 hours 12/17/16 1502 12/18/16 0542   12/17/16 1900  ceFAZolin (ANCEF) IVPB 2g/100 mL premix  Status:  Discontinued     2 g 200 mL/hr over 30 Minutes Intravenous Every 8 hours 12/17/16 1852 12/17/16 1855   12/17/16 1149  polymyxin B 500,000 Units, bacitracin 50,000 Units in sodium chloride irrigation 0.9 % 500 mL irrigation  Status:  Discontinued       As needed 12/17/16 1157 12/17/16 1341   12/17/16 0915  ceFAZolin (ANCEF) IVPB 2g/100 mL premix     2 g 200 mL/hr over 30 Minutes Intravenous On call to O.R. 12/17/16 0911  12/17/16 1131       Patient was given sequential compression devices, early ambulation, and chemoprophylaxis to prevent DVT.  Patient benefited maximally from hospital stay and there were no complications.    Recent vital signs: No data found.    Recent laboratory studies: No results for input(s): WBC, HGB, HCT, PLT, NA, K, CL, CO2, BUN, CREATININE, GLUCOSE, INR, CALCIUM in the last 72 hours.  Invalid input(s): PT, 2   Discharge Medications:   Allergies as of 12/18/2016   No Known Allergies     Medication List    STOP taking these medications   aspirin EC 81 MG tablet   ibuprofen 200 MG tablet Commonly known as:  ADVIL,MOTRIN     TAKE these medications   atorvastatin 20 MG tablet Commonly known as:  LIPITOR Take 20 mg by mouth daily.   docusate sodium 100 MG capsule Commonly known as:  COLACE Take 1 capsule (100 mg total) by mouth 2 (two) times daily as needed for mild constipation.   gabapentin 300 MG capsule Commonly known as:  NEURONTIN Take 300 mg by mouth 3 (three) times daily.   hydrochlorothiazide 12.5 MG capsule Commonly known as:  MICROZIDE Take 12.5 mg by mouth daily.   losartan 100 MG tablet Commonly known as:  COZAAR Take 100 mg by mouth daily.   methocarbamol 500 MG tablet Commonly known as:  ROBAXIN Take 1 tablet (500 mg total) by mouth  every 6 (six) hours as needed for muscle spasms. What changed:  how much to take  when to take this  reasons to take this   oxyCODONE-acetaminophen 5-325 MG tablet Commonly known as:  PERCOCET Take 1-2 tablets by mouth every 4 (four) hours as needed for severe pain.   polyethylene glycol packet Commonly known as:  MIRALAX / GLYCOLAX Take 17 g by mouth daily.       Diagnostic Studies: Dg Lumbar Spine 2-3 Views  Result Date: 12/11/2016 CLINICAL DATA:  Preop HNP EXAM: LUMBAR SPINE - 2-3 VIEW COMPARISON:  CT 10/22/2016 FINDINGS: Normal alignment. No fracture. Slight disc space narrowing at L4-5 and  L5-S1. SI joints are symmetric and unremarkable. IMPRESSION: No acute bony abnormality. Electronically Signed   By: Charlett NoseKevin  Dover M.D.   On: 12/11/2016 11:18   Dg Spine Portable 1 View  Result Date: 12/17/2016 CLINICAL DATA:  PLEASE NUMBER VERTEBRAE FOR SURGEON. Intraoperative image. Microlumbar decompression L2-3, possible L3-4. EXAM: PORTABLE SPINE - 1 VIEW COMPARISON:  01/12/2017 FINDINGS: Thumb performed at 12:28 p.m. demonstrates a posterior retractor in place. Surgical instruments are identified posterior to the body of L3. One is seen posterior to the disc space at L2-3. The other 2 instruments overlie the pedicles at L3. IMPRESSION: Intraoperative localization. Electronically Signed   By: Norva PavlovElizabeth  Brown M.D.   On: 12/17/2016 12:57   Dg Spine Portable 1 View  Result Date: 12/17/2016 CLINICAL DATA:  Intraoperative localization EXAM: PORTABLE SPINE - 1 VIEW COMPARISON:  12/11/2016 FINDINGS: Posterior surgical instruments project at the L3  pedicles. IMPRESSION: Intraoperative localization as above. Electronically Signed   By: Charlett NoseKevin  Dover M.D.   On: 12/17/2016 12:05   Dg Spine Portable 1 View  Result Date: 12/17/2016 CLINICAL DATA:  L2-3 and possible L3-4 spinal decompression. EXAM: PORTABLE SPINE - 1 VIEW COMPARISON:  12/11/2016 preoperative radiographs. FINDINGS: Single lateral view labeled 1126 hours. This demonstrates surgical devices projecting posterior to the L2 and L3 spinous processes. Degenerative disc disease involves L4-5 and L5-S1. IMPRESSION: Intraoperative localization. Electronically Signed   By: Jeronimo GreavesKyle  Talbot M.D.   On: 12/17/2016 11:56    Disposition: 01-Home or Self Care  Discharge Instructions    Call MD / Call 911    Complete by:  As directed    If you experience chest pain or shortness of breath, CALL 911 and be transported to the hospital emergency room.  If you develope a fever above 101 F, pus (white drainage) or increased drainage or redness at the wound, or calf pain,  call your surgeon's office.   Constipation Prevention    Complete by:  As directed    Drink plenty of fluids.  Prune juice may be helpful.  You may use a stool softener, such as Colace (over the counter) 100 mg twice a day.  Use MiraLax (over the counter) for constipation as needed.   Diet - low sodium heart healthy    Complete by:  As directed    Increase activity slowly as tolerated    Complete by:  As directed       Follow-up Information    Jene EveryBeane, Othniel Maret, MD In 2 weeks.   Specialty:  Orthopedic Surgery Contact information: [address] [place] [address] [phone]            Signed: Javier DockerBEANE,Gottlieb Zuercher C 12/25/2016, 10:47 AM

## 2017-01-19 NOTE — Addendum Note (Signed)
Addendum  created 01/19/17 1414 by Zuleica Seith, MD   Sign clinical note    

## 2017-01-19 NOTE — Anesthesia Postprocedure Evaluation (Signed)
Anesthesia Post Note  [facility]  Procedure(s) Performed: Procedure(s) (LRB): Microlumbar decompression L2-L3 (Left)     Anesthesia Post Evaluation  Last Vitals:  Vitals:   12/18/16 0547 12/18/16 0855  BP: 118/60 (!) 136/54  Pulse: 96 62  Resp: 16 16  Temp: 36.6 C 36.9 C    Last Pain:  Vitals:   12/18/16 1230  TempSrc:   PainSc: 3                  Henri Guedes S

## 2017-02-23 DIAGNOSIS — Z4789 Encounter for other orthopedic aftercare: Secondary | ICD-10-CM | POA: Diagnosis not present

## 2017-02-23 DIAGNOSIS — M5137 Other intervertebral disc degeneration, lumbosacral region: Secondary | ICD-10-CM | POA: Diagnosis not present

## 2017-02-23 DIAGNOSIS — Z9889 Other specified postprocedural states: Secondary | ICD-10-CM | POA: Diagnosis not present

## 2017-04-03 DIAGNOSIS — I2510 Atherosclerotic heart disease of native coronary artery without angina pectoris: Secondary | ICD-10-CM | POA: Diagnosis not present

## 2017-04-03 DIAGNOSIS — Z0000 Encounter for general adult medical examination without abnormal findings: Secondary | ICD-10-CM | POA: Diagnosis not present

## 2017-04-03 DIAGNOSIS — E7800 Pure hypercholesterolemia, unspecified: Secondary | ICD-10-CM | POA: Diagnosis not present

## 2017-04-03 DIAGNOSIS — I119 Hypertensive heart disease without heart failure: Secondary | ICD-10-CM | POA: Diagnosis not present

## 2017-08-14 DIAGNOSIS — R05 Cough: Secondary | ICD-10-CM | POA: Diagnosis not present

## 2017-10-02 DIAGNOSIS — I10 Essential (primary) hypertension: Secondary | ICD-10-CM | POA: Insufficient documentation

## 2017-10-06 ENCOUNTER — Encounter: Payer: Self-pay | Admitting: Cardiovascular Disease

## 2017-10-06 ENCOUNTER — Ambulatory Visit: Payer: Medicare Other | Admitting: Cardiovascular Disease

## 2017-10-06 VITALS — BP 134/72 | HR 60 | Ht 71.0 in | Wt 252.0 lb

## 2017-10-06 DIAGNOSIS — E668 Other obesity: Secondary | ICD-10-CM | POA: Diagnosis not present

## 2017-10-06 DIAGNOSIS — I2510 Atherosclerotic heart disease of native coronary artery without angina pectoris: Secondary | ICD-10-CM

## 2017-10-06 DIAGNOSIS — E7800 Pure hypercholesterolemia, unspecified: Secondary | ICD-10-CM

## 2017-10-06 DIAGNOSIS — I10 Essential (primary) hypertension: Secondary | ICD-10-CM

## 2017-10-06 NOTE — Patient Instructions (Signed)
Dr Croitoru recommends that you schedule a follow-up appointment in 12 months. You will receive a reminder letter in the mail two months in advance. If you don't receive a letter, please call our office to schedule the follow-up appointment.  If you need a refill on your cardiac medications before your next appointment, please call your pharmacy. 

## 2017-10-06 NOTE — Progress Notes (Signed)
Cardiology Office Note:    Date:  10/06/2017   ID:  Driscilla Grammes, DOB [birth year], MRN [id number]  PCP:  Maurice Small, MD  Cardiologist:  Thurmon Fair, MD   Referring MD: Maurice Small, MD   Chief Complaint  Patient presents with  . Follow-up  CAD  History of Present Illness:    Adrian [name] is a 67 y.o. male with a hx of moderate to severe obesity and has hypertension and hyperlipidemia and multivessel coronary disease for which he underwent bypass surgery in September 2011 (LIMA to LAD, SVG to ramus intermediate, SVG to circumflex marginal, SVG to posterior descending branch of the right coronary artery, EVH from a left side by Dr. Donata Clay ).  He has had some mild and rather vague chest discomfort radiating along the intercostal area to his low retrosternal area.  This occurs at rest, often present while he is lying in bed and last for hours or days at a time.  It is not worse with physical activity.  He was able to shovel snow earlier this winter without any angina.  He exercises on a stationary bike for 15 minutes at a time 3 times a day, every day.  The chest discomfort is never worsened or provoked by physical activity.  It is also very different from his previous angina which is described as "somebody standing on me", pointing to his mid retrosternal area.  He denies palpitations, syncope, exertional angina, leg pain with a pattern of typical claudication, leg edema, focal neurological complaints, orthopnea or PND.  He continues to have a neuropathic sounding pain radiating down his left leg, not helped by back surgery.  He had a normal nuclear stress test in April 2018.  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  His ECG shows chronic T wave inversion in the precordial leads V1-V3.  He is  taking a statin and low-dose aspirin and his blood pressure is well controlled.  Past Medical History:  Diagnosis Date  . CAD (coronary artery disease)   . Chest wall pain following surgery   . Diabetes mellitus without complication (HCC)    pt states that he is not a diabetic; and does not take any diabetes medication  . Dyspnea    went to ED for SOB in january  . Hyperlipidemia   . Hypertension   . Myocardial infarction (HCC)    2011  . Pleural effusion, left     Past Surgical History:  Procedure Laterality Date  . CARDIAC CATHETERIZATION  04/29/10   LEFT MAIN-FAIRLY SHORT VESSEL, FREE OF DISEASE. LAD 70-90%. A CERTAIN SEGMENT APPEARS SLIGHTLY ULCERATED, THERE MAYBE ACTIVE RUPTURED PLAQUE. RAMUS INTERMEDIUS 95% IN THE PROXIMAL THIRD. LEFT CX 70-80% STENOSIS IN THE MID OM. RCA 40-50% OSTIAL STENOSIS. REFERRED FOR CABG.  . CAROTID DUPLEX Bilateral 04/29/10   NO ICA STENOSIS BIL.+  . CORONARY ARTERY BYPASS GRAFT  04/30/10   X4  . Coronary artery bypass grafting  05/01/2010  . lumbar laminectomy in 2006 by Dr. Pennie Banter    . LUMBAR LAMINECTOMY/DECOMPRESSION MICRODISCECTOMY Left 12/17/2016   Procedure: Microlumbar decompression L2-L3;  Surgeon: Jene Every, MD;  Location: [facility];  Service: Orthopedics;  Laterality: Left;  Requests for 2 hrs  . NUCLEAR STRESS TEST N/A 03/01/10   NORMAL PERFUSION PATTERN IN ALL REGIONS.  EF 69%.NL MYOCARDIAL PERFUSION STUDY.  . TRANSTHORACIC ECHOCARDIOGRAM N/A 04/27/10   LV SIZE IS NORMAL. SYSTOLIC FUNC IS NORMAL.    Current Medications: Current Meds  Medication Sig  . aspirin EC 81 MG tablet Take 81 mg by mouth daily.  Marland Kitchen atorvastatin (LIPITOR) 20 MG tablet Take 20 mg by mouth daily.   . hydrochlorothiazide (MICROZIDE) 12.5 MG capsule Take 12.5 mg by mouth daily.  Marland Kitchen losartan (COZAAR) 100 MG tablet Take 100 mg by mouth daily.      Allergies:   Patient has no known allergies.   Social History   Socioeconomic History  . Marital status: Married     Spouse name: None  . Number of children: None  . Years of education: None  . Highest education level: None  Social Needs  . Financial resource strain: None  . Food insecurity - worry: None  . Food insecurity - inability: None  . Transportation needs - medical: None  . Transportation needs - non-medical: None  Occupational History  . None  Tobacco Use  . Smoking status: Never Smoker  . Smokeless tobacco: Never Used  Substance and Sexual Activity  . Alcohol use: Yes    Comment: 3x a week  . Drug use: No  . Sexual activity: None  Other Topics Concern  . None  Social History Narrative  . None     Family History: The patient's Family history is unknown by patient.  ROS:   Please see the history of present illness.     All other systems reviewed and are negative.  EKGs/Labs/Other Studies Reviewed:    The following studies were reviewed today: Myocardial perfusion images and CT angiogram from March/April 2018  EKG:  EKG is ordered today.  The ekg ordered today demonstrates sinus rhythm, minor intraventricular conduction delay most closely suggesting incomplete right bundle branch block, T wave inversion V1-V3, unchanged from previous tracings, normal QTC 422 ms  Recent Labs: 12/18/2016: BUN 21; Creatinine, Ser 1.13; Hemoglobin 13.3; Platelets 204; Potassium 4.2; Sodium 138  Recent Lipid Panel    Component Value Date/Time   CHOL (H) 04/27/2010 0747    223        ATP III CLASSIFICATION:  <200     mg/dL   Desirable  409-811  mg/dL   Borderline High  >=914    mg/dL   High          TRIG 96 04/27/2010 0747   HDL 55 04/27/2010 0747   CHOLHDL 4.1 04/27/2010 0747   VLDL 19 04/27/2010 0747   LDLCALC (H) 04/27/2010 0747    149        Total Cholesterol/HDL:CHD Risk Coronary Heart Disease Risk Table                     Men   Women  1/2 Average Risk   3.4   3.3  Average Risk       5.0   4.4  2 X Average Risk   9.6   7.1  3 X Average Risk  23.4   11.0        Use the  calculated Patient Ratio above and the CHD Risk Table to determine the patient's CHD Risk.        ATP III CLASSIFICATION (LDL):  <100     mg/dL   Optimal  782-956  mg/dL   Near or Above  Optimal  130-159  mg/dL   Borderline  [id number]  mg/dL   High  >914>190     mg/dL   Very High    Physical Exam:    VS:  BP 134/72   Pulse 60   Ht 5\' 11"  (1.803 m)   Wt 252 lb (114.3 kg)   BMI 35.15 kg/m     Wt Readings from Last 3 Encounters:  10/06/17 252 lb (114.3 kg)  12/17/16 249 lb (112.9 kg)  12/11/16 249 lb 9.6 oz (113.2 kg)     GEN: Moderately obese, but also fairly muscular, well nourished, well developed in no acute distress HEENT: Normal NECK: No JVD; No carotid bruits LYMPHATICS: No lymphadenopathy CARDIAC: RRR, no murmurs, rubs, gallops RESPIRATORY:  Clear to auscultation without rales, wheezing or rhonchi  ABDOMEN: Soft, non-tender, non-distended MUSCULOSKELETAL:  No edema; No deformity  SKIN: Warm and dry NEUROLOGIC:  Alert and oriented x 3 PSYCHIATRIC:  Normal affect   ASSESSMENT:    1. Coronary artery disease involving native coronary artery of native heart without angina pectoris   2. Essential hypertension   3. Hypercholesteremia   4. Moderate obesity    PLAN:    In order of problems listed above:  1. CAD s/p CABG: Currently describe chest discomfort is atypical and very different from his previous angina.  It is not exertional related.  He has no new abnormalities on his ECG and a recent nuclear stress test was normal.  I do not think his chest discomfort is coronary or in any other way cardiac related.  Might be related to thoracic spine abnormalities.  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 2. HTN: Fair control Lipid Panel: monitored by PCP 3. HLP: Monitored by PCP. 4. Obesity: Weight loss is strongly recommended.  He seems to be exercising enough, but I recommended that he curtail his  intake of sugars and easily digestible starches such as pasta, rice, white bread, potatoes, bananas, etc.   Medication Adjustments/Labs and Tests Ordered: Current medicines are reviewed at length with the patient today.  Concerns regarding medicines are outlined above.  No orders of the defined types were placed in this encounter.  No orders of the defined types were placed in this encounter.   Signed, Thurmon FairMihai Alazia Crocket, MD  10/06/2017 9:24 AM     [facility]

## 2017-12-28 DIAGNOSIS — M47816 Spondylosis without myelopathy or radiculopathy, lumbar region: Secondary | ICD-10-CM | POA: Diagnosis not present

## 2017-12-28 DIAGNOSIS — M545 Low back pain: Secondary | ICD-10-CM | POA: Diagnosis not present

## 2017-12-28 DIAGNOSIS — M791 Myalgia, unspecified site: Secondary | ICD-10-CM | POA: Diagnosis not present

## 2018-04-06 DIAGNOSIS — Z1159 Encounter for screening for other viral diseases: Secondary | ICD-10-CM | POA: Diagnosis not present

## 2018-04-06 DIAGNOSIS — Z0000 Encounter for general adult medical examination without abnormal findings: Secondary | ICD-10-CM | POA: Diagnosis not present

## 2018-04-06 DIAGNOSIS — I2510 Atherosclerotic heart disease of native coronary artery without angina pectoris: Secondary | ICD-10-CM | POA: Diagnosis not present

## 2018-04-06 DIAGNOSIS — E7800 Pure hypercholesterolemia, unspecified: Secondary | ICD-10-CM | POA: Diagnosis not present

## 2018-04-06 DIAGNOSIS — I119 Hypertensive heart disease without heart failure: Secondary | ICD-10-CM | POA: Diagnosis not present

## 2018-07-31 DIAGNOSIS — J209 Acute bronchitis, unspecified: Secondary | ICD-10-CM | POA: Diagnosis not present

## 2018-09-07 DIAGNOSIS — B078 Other viral warts: Secondary | ICD-10-CM | POA: Diagnosis not present

## 2018-10-13 IMAGING — CT CT ANGIO AOBIFEM WO/W CM
2 of 10 series · 12 of 46 positions shown, 15 images · IV contrast (ISOVUE 370)
Comparison: CT 08/28/2016

CLINICAL DATA: 65-year-old male with a history of left thigh pain
on unable to bear weight

EXAM:
CT ANGIOGRAPHY OF ABDOMINAL AORTA WITH ILIOFEMORAL RUNOFF
TECHNIQUE: Multidetector CT imaging of the abdomen, pelvis and lower
extremities was performed using the standard protocol during bolus
administration of intravenous contrast. Multiplanar CT image
reconstructions and MIPs were obtained to evaluate the vascular
anatomy.
CONTRAST:  100 cc Isovue 370

[Series 4: axial arterial · axial · arterial · 0.98mm/px · z∈[+302,+1631]mm · 11 of 502 slices shown, 13 images]
[im 39/502  soft-tissue]
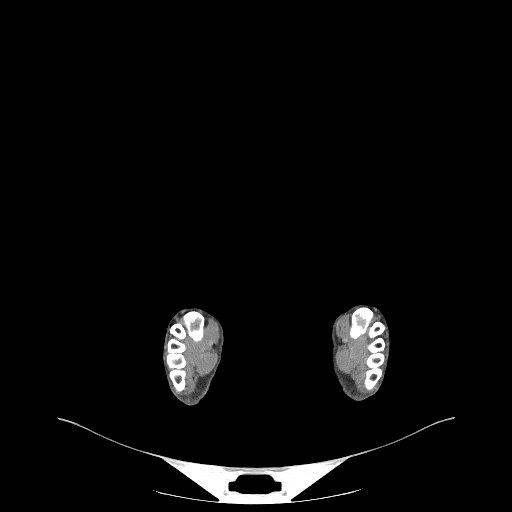
[im 39/502  bone]
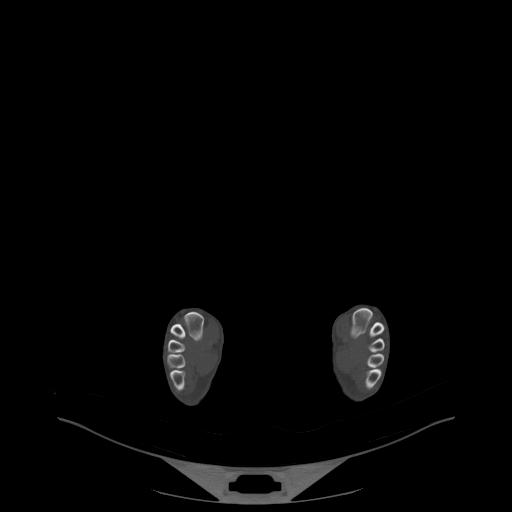
[im 97/502  soft-tissue]
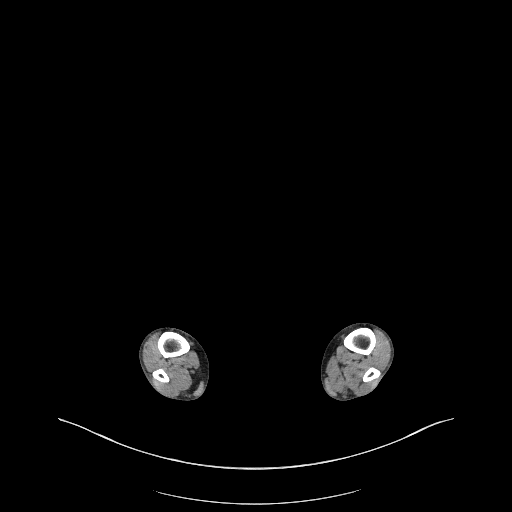
[im 155/502  soft-tissue]
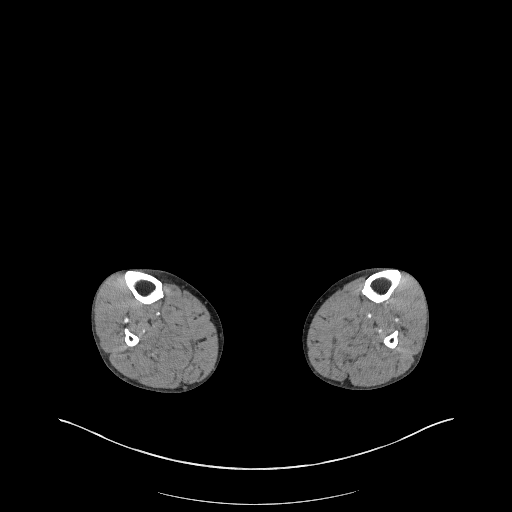
[im 212/502  soft-tissue]
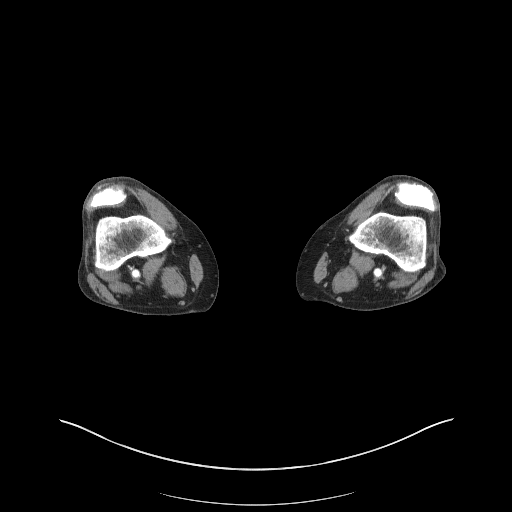
[im 290/502  soft-tissue]
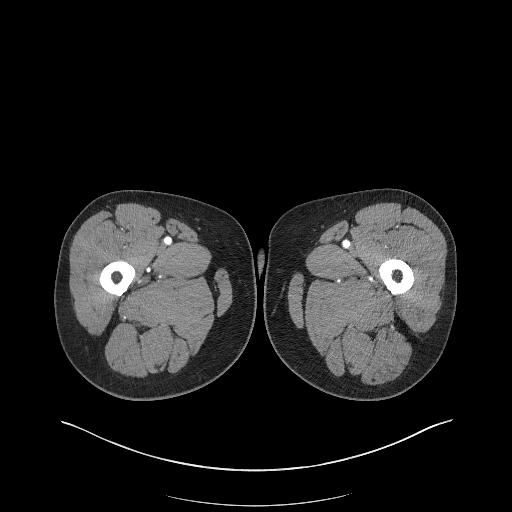
[im 347/502  soft-tissue]
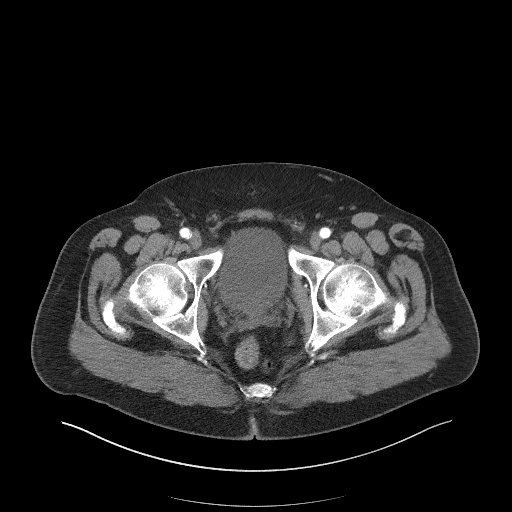
[im 405/502  soft-tissue]
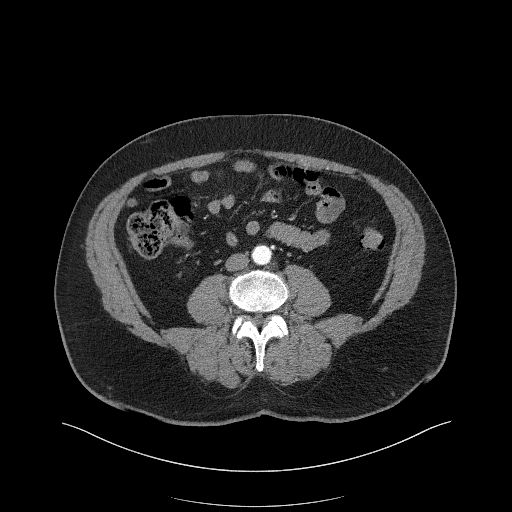
[im 424/502  lung]
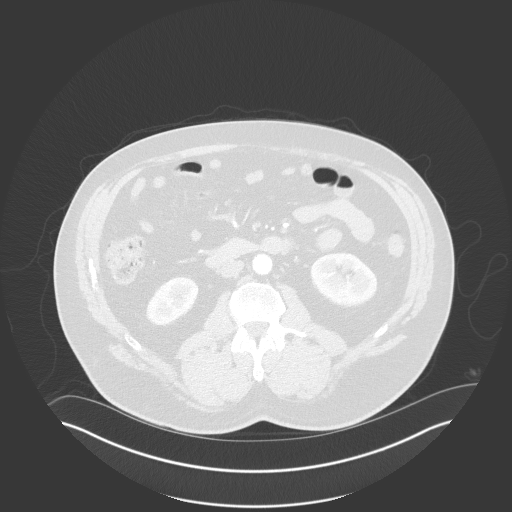
[im 444/502  lung]
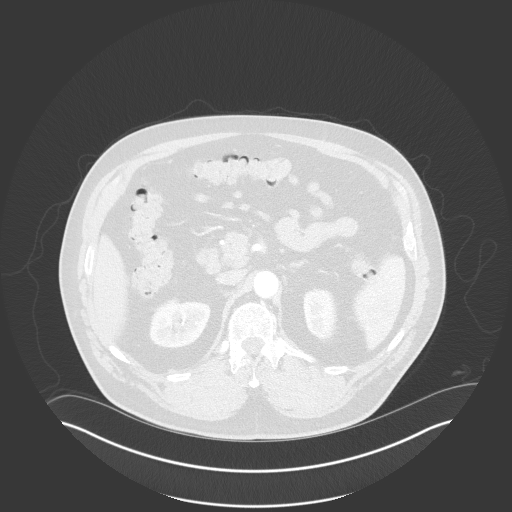
[im 463/502  soft-tissue]
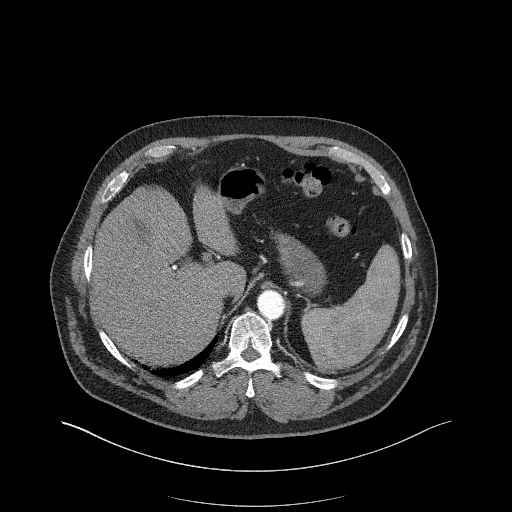
[im 463/502  lung]
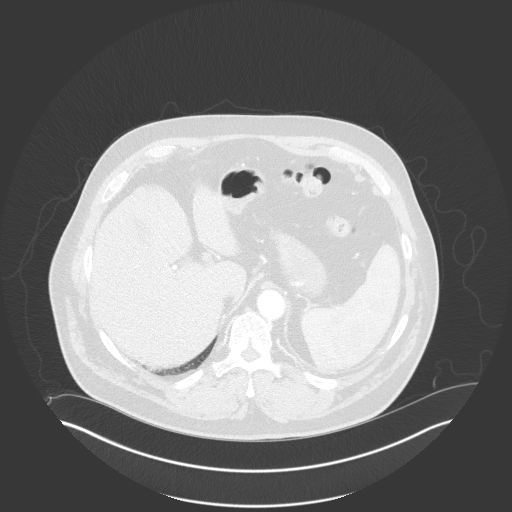
[im 482/502  lung]
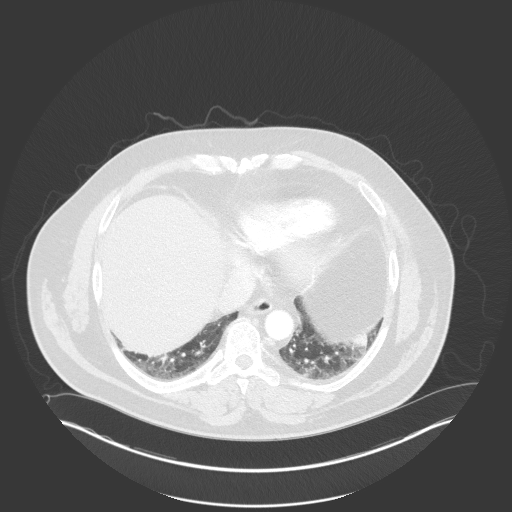

[Series 602: mpr cor upper abdomen · coronal · 1.33mm/px · 1 of 151 slices shown, 2 images]
[im 76/151  soft-tissue]
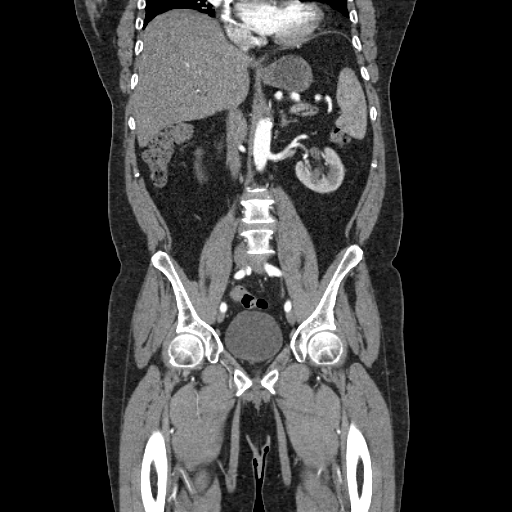
[im 76/151  bone]
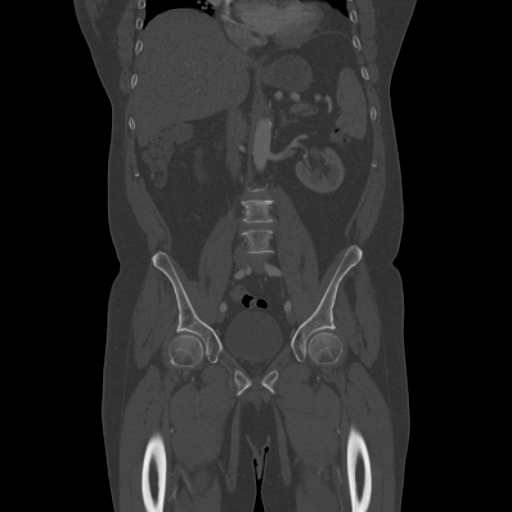

[12 of 46 positions shown; findings below may reference images not displayed]

FINDINGS: VASCULAR

Aorta: Unremarkable course, caliber, contour of the abdominal aorta.
No dissection, aneurysm, or periaortic fluid. Minimal calcified
atherosclerotic changes.

Celiac: No significant atherosclerotic changes at the origin of the
celiac artery. Typical branch pattern of the celiac artery, with
left gastric, common hepatic, and splenic artery identified.

SMA: Soft plaque at the origin of the superior mesenteric artery
with perhaps 50% narrowing. Distal branches are patent. No
inflammatory changes.

Renals: Renal arteries are patent. Single left and single right
renal artery.

IMA: Inferior mesenteric artery is patent. Left colic artery patent.
Superior rectal artery patent.

Right lower extremity:

Unremarkable course, caliber, and contour of the right iliac system.
No aneurysm, dissection, or occlusion. Hypogastric artery is patent.
Anterior and posterior division patent. Common femoral artery
patent. Proximal SFA and profunda femoris patent.

Popliteal artery patent. Trifurcation patent with proximal tibial
vessels patent. The distal tibial vessels are poorly opacified,
likely secondary to late arrival of the contrast bolus.

Left lower extremity:

Unremarkable course, caliber, and contour of the left iliac system.
No aneurysm, dissection, or occlusion. Hypogastric artery is patent.
Anterior and posterior division patent. Common femoral artery
patent. Proximal SFA and profunda femoris patent.

Popliteal artery patent. Trifurcation is patent. Proximal tibial
vessels patent. Distal tibial vessels are poorly opacified, likely
secondary to late arrival of the contrast bolus.

Veins: Unremarkable appearance of the venous system.

Review of the MIP images confirms the above findings.

NON-VASCULAR

Lower chest: Atelectatic changes/ scarring at the bilateral lung
bases. No pericardial fluid.

Hepatobiliary: Unremarkable appearance of the liver. Unremarkable
gall bladder.

Pancreas: Unremarkable appearance of the pancreas. No
pericholecystic fluid or inflammatory changes. Unremarkable ductal
system.

Spleen: Unremarkable.

Adrenals/Urinary Tract: Unremarkable appearance of adrenal glands.

Right:

No hydronephrosis. Symmetric perfusion to the left. No
nephrolithiasis. Unremarkable course of the right ureter.

Left:

Malrotation left kidney. No hydronephrosis. Symmetric perfusion to
the right. No nephrolithiasis. Unremarkable course of the left
ureter.

Unremarkable appearance of the urinary bladder .

Stomach/Bowel: Unremarkable appearance of the stomach. Unremarkable
appearance of small bowel. No evidence of obstruction. Colonic
diverticula without evidence of acute inflammatory changes. Appendix
is not visualized, however, no inflammatory changes are present
adjacent to the cecum to indicate an appendicitis.

Lymphatic: Multiple lymph nodes in the para-aortic nodal station,
none of which are enlarged.

Mesenteric: No free fluid or air. No adenopathy.

Reproductive: Unremarkable appearance of the pelvic organs.

Other: No hernia.

Musculoskeletal: No displaced fracture. Degenerative changes of the
lumbar spine with no bony canal narrowing. Vacuum disc phenomenon at
L4-L5. Partial sacralization of the L5 level with pseudoarticulation
of the transverse process on the left.

Early degenerative disc disease with broad-based disc bulge evident
at L2-L3, L3-L4, and L4-L5
IMPRESSION: No acute finding of the vascular system.

Minimal aortic atherosclerosis and lower extremity arterial disease
with no significant stenosis or occlusion. The bilateral femoral
popliteal system patent to the knee. Bilateral tibial vessels are
poorly opacified, which is favored to be late arrival of the
contrast bolus. If further evaluation of the distal vessels is
warranted, consider noninvasive exam with ABI and segmental
examination.

Atherosclerotic plaque at the origin of the superior mesenteric
artery with perhaps 50% stenosis. The celiac artery and inferior
mesenteric artery are patent.

Early degenerative disc disease with broad-based disc bulge evident
at L2-L3, L3-L4, L4-L5.

## 2018-10-21 ENCOUNTER — Encounter: Payer: Self-pay | Admitting: Cardiovascular Disease

## 2018-10-21 ENCOUNTER — Ambulatory Visit: Payer: Medicare Other | Admitting: Cardiovascular Disease

## 2018-10-21 VITALS — BP 131/72 | HR 69 | Ht 70.0 in | Wt 249.6 lb

## 2018-10-21 DIAGNOSIS — I2510 Atherosclerotic heart disease of native coronary artery without angina pectoris: Secondary | ICD-10-CM

## 2018-10-21 DIAGNOSIS — Z6835 Body mass index (BMI) 35.0-35.9, adult: Secondary | ICD-10-CM

## 2018-10-21 DIAGNOSIS — I10 Essential (primary) hypertension: Secondary | ICD-10-CM | POA: Diagnosis not present

## 2018-10-21 DIAGNOSIS — E782 Mixed hyperlipidemia: Secondary | ICD-10-CM

## 2018-10-21 MED ORDER — ATORVASTATIN CALCIUM 40 MG PO TABS: 40.0000 mg | ORAL_TABLET | Freq: Every day | ORAL | 1 refills | Status: DC

## 2018-10-21 MED ORDER — HYDROCHLOROTHIAZIDE 12.5 MG PO CAPS: 12.5000 mg | ORAL_CAPSULE | Freq: Every day | ORAL | 1 refills | Status: DC

## 2018-10-21 MED ORDER — LOSARTAN POTASSIUM 100 MG PO TABS: 100.0000 mg | ORAL_TABLET | Freq: Every day | ORAL | 1 refills | Status: AC

## 2018-10-21 NOTE — Patient Instructions (Signed)
Medication Instructions:  Increase Atorvastatin to 40 mg daily.  If you need a refill on your cardiac medications before your next appointment, please call your pharmacy.   Lab work: Please have blood work done at PCP office.   Follow-Up: At [facility], you and your health needs are our priority.  As part of our continuing mission to provide you with exceptional heart care, we have created designated Provider Care Teams.  These Care Teams include your primary Cardiologist (physician) and Advanced Practice Providers (APPs -  Physician Assistants and Nurse Practitioners) who all work together to provide you with the care you need, when you need it. You will need a follow up appointment in 1 year.  Please call our office 2 months in advance to schedule this appointment.  You may see Thurmon Fair, MD or one of the following Advanced Practice Providers on your designated Care Team: [place], [place] . Micah Flesher, PA-C  Any Other Special Instructions Will Be Listed Below (If Applicable). None

## 2018-10-21 NOTE — Progress Notes (Signed)
Cardiology Office Note:    Date:  10/21/2018   ID:  Adrian [name], DOB 1951/08/11, MRN [id number]  PCP:  Maurice Small, MD  Cardiologist:  Thurmon Fair, MD   Referring MD: Maurice Small, MD   Chief Complaint  Patient presents with  . Coronary Artery Disease  CAD  History of Present Illness:    Adrian [name] is a 68 y.o. male with a hx of moderate to severe obesity and has hypertension and hyperlipidemia and multivessel coronary disease for which he underwent bypass surgery in September 2011 (LIMA to LAD, SVG to ramus intermediate, SVG to circumflex marginal, SVG to posterior descending branch of the right coronary artery, EVH from a left side by Dr. Donata Clay ).  He is generally done very well.  He continues to have occasional "twinges" in the low retrosternal area, but these occur at rest, last for only 1 or 2 seconds and never occurred during physical activity.  His previous angina was described as midsternal "like somebody standing on me".  He has been a little less diligent about physical exercise, only riding a stationary bike for 15 minutes once or twice a day instead of 3 times a day as he did in the past.  The patient specifically denies dyspnea at rest or with exertion, orthopnea, paroxysmal nocturnal dyspnea, syncope, palpitations, focal neurological deficits, intermittent claudication, lower extremity edema, unexplained weight gain, cough, hemoptysis or wheezing.  Complains of bilateral leg pain.  He had a normal nuclear stress test in April 2018.  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  His ECG shows chronic T wave inversion in the precordial leads V1-V3.  He is taking a statin and low-dose aspirin and his blood pressure is well controlled.  Past Medical History:  Diagnosis Date  .  CAD (coronary artery disease)   . Chest wall pain following surgery   . Diabetes mellitus without complication (HCC)    pt states that he is not a diabetic; and does not take any diabetes medication  . Dyspnea    went to ED for SOB in january  . Hyperlipidemia   . Hypertension   . Myocardial infarction (HCC)    2011  . Pleural effusion, left     Past Surgical History:  Procedure Laterality Date  . CARDIAC CATHETERIZATION  04/29/10   LEFT MAIN-FAIRLY SHORT VESSEL, FREE OF DISEASE. LAD 70-90%. A CERTAIN SEGMENT APPEARS SLIGHTLY ULCERATED, THERE MAYBE ACTIVE RUPTURED PLAQUE. RAMUS INTERMEDIUS 95% IN THE PROXIMAL THIRD. LEFT CX 70-80% STENOSIS IN THE MID OM. RCA 40-50% OSTIAL STENOSIS. REFERRED FOR CABG.  . CAROTID DUPLEX Bilateral 04/29/10   NO ICA STENOSIS BIL.+  . CORONARY ARTERY BYPASS GRAFT  04/30/10   X4  . Coronary artery bypass grafting  05/01/2010  . lumbar laminectomy in 2006 by Dr. Pennie Banter    . LUMBAR LAMINECTOMY/DECOMPRESSION MICRODISCECTOMY Left 12/17/2016   Procedure: Microlumbar decompression L2-L3;  Surgeon: Jene Every, MD;  Location: [facility];  Service: Orthopedics;  Laterality: Left;  Requests for 2 hrs  . NUCLEAR STRESS TEST N/A 03/01/10   NORMAL PERFUSION PATTERN IN ALL REGIONS. EF 69%.NL MYOCARDIAL PERFUSION STUDY.  . TRANSTHORACIC ECHOCARDIOGRAM N/A 04/27/10   LV SIZE IS NORMAL. SYSTOLIC FUNC IS NORMAL.    Current Medications: Current Meds  Medication Sig  . aspirin EC  81 MG tablet Take 81 mg by mouth daily.  Marland Kitchen atorvastatin (LIPITOR) 40 MG tablet Take 1 tablet (40 mg total) by mouth daily.  . hydrochlorothiazide (MICROZIDE) 12.5 MG capsule Take 1 capsule (12.5 mg total) by mouth daily.  Marland Kitchen losartan (COZAAR) 100 MG tablet Take 1 tablet (100 mg total) by mouth daily.  . [DISCONTINUED] atorvastatin (LIPITOR) 20 MG tablet Take 20 mg by mouth daily.   . [DISCONTINUED] hydrochlorothiazide (MICROZIDE) 12.5 MG capsule Take 12.5 mg by mouth daily.  . [DISCONTINUED] losartan  (COZAAR) 100 MG tablet Take 100 mg by mouth daily.      Allergies:   Patient has no known allergies.   Social History   Socioeconomic History  . Marital status: Married    Spouse name: Not on file  . Number of children: Not on file  . Years of education: Not on file  . Highest education level: Not on file  Occupational History  . Not on file  Social Needs  . Financial resource strain: Not on file  . Food insecurity:    Worry: Not on file    Inability: Not on file  . Transportation needs:    Medical: Not on file    Non-medical: Not on file  Tobacco Use  . Smoking status: Never Smoker  . Smokeless tobacco: Never Used  Substance and Sexual Activity  . Alcohol use: Yes    Comment: 3x a week  . Drug use: No  . Sexual activity: Not on file  Lifestyle  . Physical activity:    Days per week: Not on file    Minutes per session: Not on file  . Stress: Not on file  Relationships  . Social connections:    Talks on phone: Not on file    Gets together: Not on file    Attends religious service: Not on file    Active member of club or organization: Not on file    Attends meetings of clubs or organizations: Not on file    Relationship status: Not on file  Other Topics Concern  . Not on file  Social History Narrative  . Not on file     Family History: The patient's Family history is unknown by patient.  ROS:   Please see the history of present illness.     All other systems reviewed and are negative.  EKGs/Labs/Other Studies Reviewed:    The following studies were reviewed today: Myocardial perfusion images and CT angiogram from March/April 2018  EKG:  EKG is ordered today.  It is unchanged from previous tracing showing sinus rhythm and nonspecific T wave inversion leads V1-V3, QTC 443 ms  Recent Labs: No results found for requested labs within last 8760 hours.  Recent Lipid Panel    Component Value Date/Time   CHOL (H) 04/27/2010 0747    223        ATP III  CLASSIFICATION:  <200     mg/dL   Desirable  409-811  mg/dL   Borderline High  >=914    mg/dL   High          TRIG 96 04/27/2010 0747   HDL 55 04/27/2010 0747   CHOLHDL 4.1 04/27/2010 0747   VLDL 19 04/27/2010 0747   LDLCALC (H) 04/27/2010 0747    149        Total Cholesterol/HDL:CHD Risk Coronary Heart Disease Risk Table  Men   Women  1/2 Average Risk   3.4   3.3  Average Risk       5.0   4.4  2 X Average Risk   9.6   7.1  3 X Average Risk  23.4   11.0        Use the calculated Patient Ratio above and the CHD Risk Table to determine the patient's CHD Risk.        ATP III CLASSIFICATION (LDL):  <100     mg/dL   Optimal  376-283  mg/dL   Near or Above                    Optimal  130-159  mg/dL   Borderline  151-761  mg/dL   High  >607     mg/dL   Very High    April 06, 2018 lipid profile Cholesterol 181, HDL 41, LDL 80, triglycerides 298  Physical Exam:    VS:  BP 131/72   Pulse 69   Ht 5\' 10"  (1.778 m)   Wt 249 lb 9.6 oz (113.2 kg)   BMI 35.81 kg/m     Wt Readings from Last 3 Encounters:  10/21/18 249 lb 9.6 oz (113.2 kg)  10/06/17 252 lb (114.3 kg)  12/17/16 249 lb (112.9 kg)     General: Alert, oriented x3, no distress, moderately obese Head: no evidence of trauma, PERRL, EOMI, no exophtalmos or lid lag, no myxedema, no xanthelasma; normal ears, nose and oropharynx Neck: normal jugular venous pulsations and no hepatojugular reflux; brisk carotid pulses without delay and no carotid bruits Chest: clear to auscultation, no signs of consolidation by percussion or palpation, normal fremitus, symmetrical and full respiratory excursions Cardiovascular: normal position and quality of the apical impulse, regular rhythm, normal first and second heart sounds, no murmurs, rubs or gallops Abdomen: no tenderness or distention, no masses by palpation, no abnormal pulsatility or arterial bruits, normal bowel sounds, no hepatosplenomegaly Extremities: no  clubbing, cyanosis or edema; 2+ radial, ulnar and brachial pulses bilaterally; 2+ right femoral, posterior tibial and dorsalis pedis pulses; 2+ left femoral, posterior tibial and dorsalis pedis pulses; no subclavian or femoral bruits Neurological: grossly nonfocal Psych: Normal mood and affect   ASSESSMENT:    1. Coronary artery disease involving native coronary artery of native heart without angina pectoris   2. Essential hypertension   3. Mixed hyperlipidemia   4. Severe obesity (BMI 35.0-35.9 with comorbidity) (HCC)    PLAN:    In order of problems listed above:  1. CAD s/p CABG: Atypical chest discomfort sounds like it could be neuralgic/musculoskeletal rather than angina.  Encouraged him to exercise more frequently on his home bicycle.  He has not had symptoms during activity.  He had a normal nuclear stress test less than 2 years ago.  The ECG is unchanged from baseline.  He specifically asked about the risk of bypasses closing down and have another heart attack.  We reviewed the fact that doing "maintenance monitoring angiography" is not beneficial, but periodic functional evaluation with a treadmill stress test is reasonable.  Consider doing that again next year.  He is rather scared of treadmill stress testing since he became very symptomatic when he had the stress test before his bypass procedure.  He does not think that he can walk on the treadmill due to his knees at this point. 2. HTN: Adequate control 3. HLP: LDL cholesterol is not far from target, but he has  elevated triglycerides and a rather low HDL cholesterol.  This is consistent with insulin resistance/metabolic syndrome.  Increase atorvastatin to 40 mg daily.  I do not think he requires concomitant treatment with a fibrate, but if exercise and diet do not improve his triglycerides, we can consider Vascepa. 4. Obesity: Weight loss is strongly recommended.  He should go back to his more regular program of physical activity.   Avoid sugars and starches.   Medication Adjustments/Labs and Tests Ordered: Current medicines are reviewed at length with the patient today.  Concerns regarding medicines are outlined above.  Orders Placed This Encounter  Procedures  . EKG 12-Lead   Meds ordered this encounter  Medications  . atorvastatin (LIPITOR) 40 MG tablet    Sig: Take 1 tablet (40 mg total) by mouth daily.    Dispense:  90 tablet    Refill:  1  . losartan (COZAAR) 100 MG tablet    Sig: Take 1 tablet (100 mg total) by mouth daily.    Dispense:  90 tablet    Refill:  1  . hydrochlorothiazide (MICROZIDE) 12.5 MG capsule    Sig: Take 1 capsule (12.5 mg total) by mouth daily.    Dispense:  90 capsule    Refill:  1    Signed, Thurmon Fair, MD  10/21/2018 6:16 PM    [facility]

## 2019-03-02 DIAGNOSIS — M545 Low back pain: Secondary | ICD-10-CM | POA: Diagnosis not present

## 2019-04-22 ENCOUNTER — Other Ambulatory Visit: Payer: Self-pay | Admitting: Cardiovascular Disease

## 2019-05-19 DIAGNOSIS — I119 Hypertensive heart disease without heart failure: Secondary | ICD-10-CM | POA: Diagnosis not present

## 2019-05-19 DIAGNOSIS — R7303 Prediabetes: Secondary | ICD-10-CM | POA: Diagnosis not present

## 2019-05-19 DIAGNOSIS — I2510 Atherosclerotic heart disease of native coronary artery without angina pectoris: Secondary | ICD-10-CM | POA: Diagnosis not present

## 2019-05-19 DIAGNOSIS — Z0000 Encounter for general adult medical examination without abnormal findings: Secondary | ICD-10-CM | POA: Diagnosis not present

## 2019-05-20 DIAGNOSIS — I119 Hypertensive heart disease without heart failure: Secondary | ICD-10-CM | POA: Diagnosis not present

## 2019-05-20 DIAGNOSIS — R7303 Prediabetes: Secondary | ICD-10-CM | POA: Diagnosis not present

## 2019-05-20 DIAGNOSIS — I2510 Atherosclerotic heart disease of native coronary artery without angina pectoris: Secondary | ICD-10-CM | POA: Diagnosis not present

## 2019-05-20 DIAGNOSIS — Z23 Encounter for immunization: Secondary | ICD-10-CM | POA: Diagnosis not present

## 2019-06-06 DIAGNOSIS — M7541 Impingement syndrome of right shoulder: Secondary | ICD-10-CM | POA: Diagnosis not present

## 2019-06-06 DIAGNOSIS — M25511 Pain in right shoulder: Secondary | ICD-10-CM | POA: Diagnosis not present

## 2019-10-17 ENCOUNTER — Other Ambulatory Visit: Payer: Self-pay | Admitting: Cardiovascular Disease

## 2019-10-21 ENCOUNTER — Ambulatory Visit: Payer: Medicare Other | Admitting: Cardiovascular Disease

## 2019-10-21 ENCOUNTER — Other Ambulatory Visit: Payer: Self-pay

## 2019-10-21 ENCOUNTER — Encounter: Payer: Self-pay | Admitting: Cardiovascular Disease

## 2019-10-21 VITALS — BP 154/84 | HR 75 | Temp 96.8°F | Ht 70.0 in | Wt 256.0 lb

## 2019-10-21 DIAGNOSIS — I10 Essential (primary) hypertension: Secondary | ICD-10-CM

## 2019-10-21 DIAGNOSIS — I25810 Atherosclerosis of coronary artery bypass graft(s) without angina pectoris: Secondary | ICD-10-CM

## 2019-10-21 DIAGNOSIS — E785 Hyperlipidemia, unspecified: Secondary | ICD-10-CM

## 2019-10-21 NOTE — Patient Instructions (Signed)
Medication Instructions:  No changes *If you need a refill on your cardiac medications before your next appointment, please call your pharmacy*   Lab Work: None ordered If you have labs (blood work) drawn today and your tests are completely normal, you will receive your results only by: Marland Kitchen MyChart Message (if you have MyChart) OR . A paper copy in the mail If you have any lab test that is abnormal or we need to change your treatment, we will call you to review the results.   Testing/Procedures: None ordered   Follow-Up: At [facility], you and your health needs are our priority.  As part of our continuing mission to provide you with exceptional heart care, we have created designated Provider Care Teams.  These Care Teams include your primary Cardiologist (physician) and Advanced Practice Providers (APPs -  Physician Assistants and Nurse Practitioners) who all work together to provide you with the care you need, when you need it.  We recommend signing up for the patient portal called "MyChart".  Sign up information is provided on this After Visit Summary.  MyChart is used to connect with patients for Virtual Visits (Telemedicine).  Patients are able to view lab/test results, encounter notes, upcoming appointments, etc.  Non-urgent messages can be sent to your provider as well.   To learn more about what you can do with MyChart, go to [email or web address].    Your next appointment:   12 month(s)  The format for your next appointment:   In Person  Provider:   You may see Thurmon Fair, MD or one of the following Advanced Practice Providers on your designated Care Team:    Azalee Course, PA-C  Micah Flesher, [place] or   Judy Pimple, [place]    Other Instructions Please talk to your primary care provider about Metformin for diabetes.

## 2019-10-21 NOTE — Progress Notes (Signed)
Cardiology Office Note:    Date:  10/27/2019   ID:  Driscilla Grammes, DOB 1951-03-21, MRN [id number]  PCP:  Maurice Small, MD  Cardiologist:  Thurmon Fair, MD   Referring MD: Maurice Small, MD   No chief complaint on file. CAD  History of Present Illness:    Adrian [name] is a 69 y.o. male with a hx of moderate to severe obesity and has hypertension and hyperlipidemia and multivessel coronary disease for which he underwent bypass surgery in September 2011 (LIMA to LAD, SVG to ramus intermediate, SVG to circumflex marginal, SVG to posterior descending branch of the right coronary artery, EVH from a left side by Dr. Donata Clay ).  He does not have any cardiovascular complaints but unfortunately has gained weight and his blood pressure is higher.  His most recent hemoglobin A1c was 6.6%, placing him in diabetes range, although he is not taking any glucose lowering medications.  His most recent LDL was excellent at 65 but he has a borderline low HDL cholesterol.  The patient specifically denies any chest pain at rest exertion, dyspnea at rest or with exertion, orthopnea, paroxysmal nocturnal dyspnea, syncope, palpitations, focal neurological deficits, intermittent claudication, lower extremity edema, unexplained weight gain, cough, hemoptysis or wheezing.  Still rides his bicycle, but not as consistently as he was in the past.  He had a normal nuclear stress test in April 2018.  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  His ECG shows chronic T wave inversion in the precordial leads V1-V3.    Past Medical History:  Diagnosis Date  . CAD (coronary artery disease)   . Chest wall pain following surgery   . Diabetes mellitus without complication (HCC)    pt states that he is not a diabetic; and does not take  any diabetes medication  . Dyspnea    went to ED for SOB in january  . Hyperlipidemia   . Hypertension   . Myocardial infarction (HCC)    2011  . Pleural effusion, left     Past Surgical History:  Procedure Laterality Date  . CARDIAC CATHETERIZATION  04/29/10   LEFT MAIN-FAIRLY SHORT VESSEL, FREE OF DISEASE. LAD 70-90%. A CERTAIN SEGMENT APPEARS SLIGHTLY ULCERATED, THERE MAYBE ACTIVE RUPTURED PLAQUE. RAMUS INTERMEDIUS 95% IN THE PROXIMAL THIRD. LEFT CX 70-80% STENOSIS IN THE MID OM. RCA 40-50% OSTIAL STENOSIS. REFERRED FOR CABG.  . CAROTID DUPLEX Bilateral 04/29/10   NO ICA STENOSIS BIL.+  . CORONARY ARTERY BYPASS GRAFT  04/30/10   X4  . Coronary artery bypass grafting  05/01/2010  . lumbar laminectomy in 2006 by Dr. Pennie Banter    . LUMBAR LAMINECTOMY/DECOMPRESSION MICRODISCECTOMY Left 12/17/2016   Procedure: Microlumbar decompression L2-L3;  Surgeon: Jene Every, MD;  Location: [facility];  Service: Orthopedics;  Laterality: Left;  Requests for 2 hrs  . NUCLEAR STRESS TEST N/A 03/01/10   NORMAL PERFUSION PATTERN IN ALL REGIONS. EF 69%.NL MYOCARDIAL PERFUSION STUDY.  . TRANSTHORACIC ECHOCARDIOGRAM N/A 04/27/10   LV SIZE IS NORMAL. SYSTOLIC FUNC IS NORMAL.    Current Medications: Current Meds  Medication Sig  . aspirin EC 81 MG tablet Take 81 mg by mouth daily.  Marland Kitchen atorvastatin (LIPITOR) 40 MG tablet TAKE 1 TABLET BY MOUTH DAILY  . hydrochlorothiazide (MICROZIDE) 12.5 MG capsule Take 1 capsule (12.5 mg  total) by mouth daily.  Marland Kitchen losartan (COZAAR) 100 MG tablet Take 1 tablet (100 mg total) by mouth daily.     Allergies:   Patient has no known allergies.   Social History   Socioeconomic History  . Marital status: Married    Spouse name: Not on file  . Number of children: Not on file  . Years of education: Not on file  . Highest education level: Not on file  Occupational History  . Not on file  Tobacco Use  . Smoking status: Never Smoker  . Smokeless tobacco: Never Used  Substance  and Sexual Activity  . Alcohol use: Yes    Comment: 3x a week  . Drug use: No  . Sexual activity: Not on file  Other Topics Concern  . Not on file  Social History Narrative  . Not on file   Social Determinants of Health   Financial Resource Strain:   . Difficulty of Paying Living Expenses:   Food Insecurity:   . Worried About Programme researcher, broadcasting/film/video in the Last Year:   . Barista in the Last Year:   Transportation Needs:   . Freight forwarder (Medical):   Marland Kitchen Lack of Transportation (Non-Medical):   Physical Activity:   . Days of Exercise per Week:   . Minutes of Exercise per Session:   Stress:   . Feeling of Stress :   Social Connections:   . Frequency of Communication with Friends and Family:   . Frequency of Social Gatherings with Friends and Family:   . Attends Religious Services:   . Active Member of Clubs or Organizations:   . Attends Banker Meetings:   Marland Kitchen Marital Status:      Family History: The patient's Family history is unknown by patient.  ROS:   Please see the history of present illness.    All other systems reviewed and are negative  EKGs/Labs/Other Studies Reviewed:    The following studies were reviewed today:   EKG:  EKG is ordered today.  It shows sinus rhythm with first-degree AV block (PR 226 ms), incomplete right bundle branch block, QTC 458 ms  Recent Labs: No results found for requested labs within last 8760 hours.  Recent Lipid Panel    Component Value Date/Time   CHOL (H) 04/27/2010 0747    223        ATP III CLASSIFICATION:  <200     mg/dL   Desirable  425-956  mg/dL   Borderline High  >=387    mg/dL   High          TRIG 96 04/27/2010 0747   HDL 55 04/27/2010 0747   CHOLHDL 4.1 04/27/2010 0747   VLDL 19 04/27/2010 0747   LDLCALC (H) 04/27/2010 0747    149        Total Cholesterol/HDL:CHD Risk Coronary Heart Disease Risk Table                     Men   Women  1/2 Average Risk   3.4   3.3  Average Risk        5.0   4.4  2 X Average Risk   9.6   7.1  3 X Average Risk  23.4   11.0        Use the calculated Patient Ratio above and the CHD Risk Table to determine the patient's CHD Risk.        ATP  III CLASSIFICATION (LDL):  <100     mg/dL   Optimal  100-129  mg/dL   Near or Above                    Optimal  130-159  mg/dL   Borderline  160-189  mg/dL   High  >190     mg/dL   Very High    May 20, 2019 Cholesterol 141, HDL 39, LDL 65, triglycerides 187  Physical Exam:    VS:  BP (!) 154/84   Pulse 75   Temp (!) 96.8 F (36 C)   Ht 5\' 10"  (1.778 m)   Wt 256 lb (116.1 kg)   SpO2 96%   BMI 36.73 kg/m     Wt Readings from Last 3 Encounters:  10/21/19 256 lb (116.1 kg)  10/21/18 249 lb 9.6 oz (113.2 kg)  10/06/17 252 lb (114.3 kg)      General: Alert, oriented x3, no distress, obese Head: no evidence of trauma, PERRL, EOMI, no exophtalmos or lid lag, no myxedema, no xanthelasma; normal ears, nose and oropharynx Neck: normal jugular venous pulsations and no hepatojugular reflux; brisk carotid pulses without delay and no carotid bruits Chest: clear to auscultation, no signs of consolidation by percussion or palpation, normal fremitus, symmetrical and full respiratory excursions Cardiovascular: normal position and quality of the apical impulse, regular rhythm, normal first and second heart sounds, no murmurs, rubs or gallops Abdomen: no tenderness or distention, no masses by palpation, no abnormal pulsatility or arterial bruits, normal bowel sounds, no hepatosplenomegaly Extremities: no clubbing, cyanosis or edema; 2+ radial, ulnar and brachial pulses bilaterally; 2+ right femoral, posterior tibial and dorsalis pedis pulses; 2+ left femoral, posterior tibial and dorsalis pedis pulses; no subclavian or femoral bruits Neurological: grossly nonfocal Psych: Normal mood and affect    ASSESSMENT:    1. Coronary artery disease involving coronary bypass graft of native heart without  angina pectoris   2. Essential hypertension   3. Dyslipidemia (high LDL; low HDL)   4. Severe obesity (BMI 35.0-39.9) with comorbidity ([place])    PLAN:    In order of problems listed above:  1. CAD s/p CABG: Asymptomatic.  Low risk nuclear stress test in 2018.  The focus is on risk factor modification.  He is taking aspirin, atorvastatin.   2. HTN: Blood pressure recheck was still borderline high at 139/70. 3. HLP: Excellent LDL, but continues to have borderline high triglycerides and low HDL as well as an elevated hemoglobin A1c, all consistent with insulin resistance/metabolic syndrome.  Reviewed the need for weight loss, increase physical activity, he should avoid carbohydrates with high glycemic index and increase his intake of protein and unsaturated fat. 4. Obesity: Weight loss is strongly recommended.  He should go back to his more regular program of physical activity.  Avoid sugars and starches.   Medication Adjustments/Labs and Tests Ordered: Current medicines are reviewed at length with the patient today.  Concerns regarding medicines are outlined above.  Orders Placed This Encounter  Procedures  . EKG 12-Lead   No orders of the defined types were placed in this encounter.  Patient Instructions  Medication Instructions:  No changes *If you need a refill on your cardiac medications before your next appointment, please call your pharmacy*   Lab Work: None ordered If you have labs (blood work) drawn today and your tests are completely normal, you will receive your results only by: Marland Kitchen MyChart Message (if you have MyChart) OR .  A paper copy in the mail If you have any lab test that is abnormal or we need to change your treatment, we will call you to review the results.   Testing/Procedures: None ordered   Follow-Up: At [facility], you and your health needs are our priority.  As part of our continuing mission to provide you with exceptional heart care, we have created  designated Provider Care Teams.  These Care Teams include your primary Cardiologist (physician) and Advanced Practice Providers (APPs -  Physician Assistants and Nurse Practitioners) who all work together to provide you with the care you need, when you need it.  We recommend signing up for the patient portal called "MyChart".  Sign up information is provided on this After Visit Summary.  MyChart is used to connect with patients for Virtual Visits (Telemedicine).  Patients are able to view lab/test results, encounter notes, upcoming appointments, etc.  Non-urgent messages can be sent to your provider as well.   To learn more about what you can do with MyChart, go to [email or web address].    Your next appointment:   12 month(s)  The format for your next appointment:   In Person  Provider:   You may see Thurmon Fair, MD or one of the following Advanced Practice Providers on your designated Care Team:    Azalee Course, PA-C  Micah Flesher, [place] or   Judy Pimple, [place]    Other Instructions Please talk to your primary care provider about Metformin for diabetes.     Signed, Thurmon Fair, MD  10/27/2019 3:13 PM    [facility]

## 2019-11-17 DIAGNOSIS — I119 Hypertensive heart disease without heart failure: Secondary | ICD-10-CM | POA: Diagnosis not present

## 2019-11-17 DIAGNOSIS — R7303 Prediabetes: Secondary | ICD-10-CM | POA: Diagnosis not present

## 2019-11-17 DIAGNOSIS — E7800 Pure hypercholesterolemia, unspecified: Secondary | ICD-10-CM | POA: Diagnosis not present

## 2019-11-17 DIAGNOSIS — I2510 Atherosclerotic heart disease of native coronary artery without angina pectoris: Secondary | ICD-10-CM | POA: Diagnosis not present

## 2019-11-19 ENCOUNTER — Ambulatory Visit: Payer: Medicare Other | Attending: Internal Medicine

## 2019-11-19 DIAGNOSIS — Z23 Encounter for immunization: Secondary | ICD-10-CM

## 2019-11-19 NOTE — Progress Notes (Signed)
   Covid-19 Vaccination Clinic  Name:  Adrian [name]    MRN: [id number] DOB: 19-Sep-1950  11/19/2019  Mr. Ring was observed post Covid-19 immunization for 15 minutes without incident. He was provided with Vaccine Information Sheet and instruction to access the V-Safe system.   Mr. Chew was instructed to call 911 with any severe reactions post vaccine: Marland Kitchen Difficulty breathing  . Swelling of face and throat  . A fast heartbeat  . A bad rash all over body  . Dizziness and weakness   Immunizations Administered    Name Date Dose VIS Date Route   Pfizer COVID-19 Vaccine 11/19/2019  8:48 AM 0.3 mL 07/29/2019 Intramuscular   Manufacturer: [employer], [employer]   Lot: [id number]   NDC: [address]-2

## 2019-12-02 ENCOUNTER — Other Ambulatory Visit: Payer: Self-pay | Admitting: Cardiovascular Disease

## 2019-12-13 ENCOUNTER — Ambulatory Visit: Payer: Medicare Other | Attending: Internal Medicine

## 2019-12-13 DIAGNOSIS — Z23 Encounter for immunization: Secondary | ICD-10-CM

## 2019-12-13 NOTE — Progress Notes (Signed)
   Covid-19 Vaccination Clinic  Name:  Adrian [name]    MRN: [id number] DOB: 09/20/1950  12/13/2019  Adrian [name] was observed post Covid-19 immunization for 30 minutes based on pre-vaccination screening without incident. He was provided with Vaccine Information Sheet and instruction to access the V-Safe system.   Adrian [name] was instructed to call 911 with any severe reactions post vaccine: Marland Kitchen Difficulty breathing  . Swelling of face and throat  . A fast heartbeat  . A bad rash all over body  . Dizziness and weakness   Immunizations Administered    Name Date Dose VIS Date Route   Pfizer COVID-19 Vaccine 12/13/2019  8:57 AM 0.3 mL 10/12/2018 Intramuscular   Manufacturer: [employer], [employer]   Lot: [id number]   NDC: [address]-4

## 2020-01-13 ENCOUNTER — Other Ambulatory Visit: Payer: Self-pay | Admitting: Cardiovascular Disease

## 2020-01-18 DIAGNOSIS — E119 Type 2 diabetes mellitus without complications: Secondary | ICD-10-CM | POA: Diagnosis not present

## 2020-01-24 ENCOUNTER — Telehealth: Payer: Self-pay | Admitting: *Deleted

## 2020-01-24 NOTE — Telephone Encounter (Signed)
° °  Primary Cardiologist: Thurmon Fair, MD  Chart reviewed as part of pre-operative protocol coverage. Patient last seen by Dr. Royann Shivers on 10/21/2019. Patient contacted on 01/24/2020 for pre-op risk assessment. He has done very well since last visit. No chest pain, shortness of breath, orthopnea, PND, edema, palpitations, dizziness, or syncope. Able to complete >4.0 METS of activity. He rides his bike for 30 minutes most days without any problems. Given past medical history and time since last visit, based on ACC/AHA guidelines, Adrian [name] would be at acceptable risk for the planned procedure without further cardiovascular testing.   I will route this recommendation to the requesting party via Epic fax function and remove from pre-op pool.  Please call with questions.  Corrin Parker, PA-C 01/24/2020, 4:14 PM

## 2020-01-24 NOTE — Telephone Encounter (Signed)
° °  [facility] Pre-operative Risk Assessment    HEARTCARE STAFF: - Please ensure there is not already an duplicate clearance open for this procedure. - Under Visit Info/Reason for Call, type in Other and utilize the format Clearance MM/DD/YY or Clearance TBD. Do not use dashes or single digits. - If request is for dental extraction, please clarify the # of teeth to be extracted.  Request for surgical clearance:  1. What type of surgery is being performed? COLONOSCOPY  2. When is this surgery scheduled? TBD   3. What type of clearance is required (medical clearance vs. Pharmacy clearance to hold med vs. Both)? CARDIAC  4. Are there any medications that need to be held prior to surgery and how long? N/A   5. Practice name and name of physician performing surgery? [facility] Penelope Coop   6. What is the office phone number?  [phone]   7.   What is the office fax number? [phone]  8.   Anesthesia type (None, local, MAC, general) ? PROPOFOL   Devra Dopp 01/24/2020, 12:50 PM  _________________________________________________________________   (provider comments below)

## 2020-02-21 DIAGNOSIS — E119 Type 2 diabetes mellitus without complications: Secondary | ICD-10-CM | POA: Diagnosis not present

## 2020-03-12 DIAGNOSIS — K648 Other hemorrhoids: Secondary | ICD-10-CM | POA: Diagnosis not present

## 2020-03-12 DIAGNOSIS — Z86010 Personal history of colonic polyps: Secondary | ICD-10-CM | POA: Diagnosis not present

## 2020-03-12 DIAGNOSIS — K5730 Diverticulosis of large intestine without perforation or abscess without bleeding: Secondary | ICD-10-CM | POA: Diagnosis not present

## 2020-04-19 ENCOUNTER — Other Ambulatory Visit: Payer: Self-pay | Admitting: Cardiovascular Disease

## 2020-05-21 DIAGNOSIS — E1169 Type 2 diabetes mellitus with other specified complication: Secondary | ICD-10-CM | POA: Diagnosis not present

## 2020-05-21 DIAGNOSIS — Z0000 Encounter for general adult medical examination without abnormal findings: Secondary | ICD-10-CM | POA: Diagnosis not present

## 2020-05-21 DIAGNOSIS — I2510 Atherosclerotic heart disease of native coronary artery without angina pectoris: Secondary | ICD-10-CM | POA: Diagnosis not present

## 2020-05-21 DIAGNOSIS — I119 Hypertensive heart disease without heart failure: Secondary | ICD-10-CM | POA: Diagnosis not present

## 2020-11-14 ENCOUNTER — Encounter: Payer: Self-pay | Admitting: Cardiovascular Disease

## 2020-11-14 ENCOUNTER — Other Ambulatory Visit: Payer: Self-pay

## 2020-11-14 ENCOUNTER — Ambulatory Visit: Payer: Medicare Other | Admitting: Cardiovascular Disease

## 2020-11-14 VITALS — BP 160/81 | HR 54 | Ht 69.0 in | Wt 241.8 lb

## 2020-11-14 DIAGNOSIS — I10 Essential (primary) hypertension: Secondary | ICD-10-CM | POA: Diagnosis not present

## 2020-11-14 DIAGNOSIS — I25810 Atherosclerosis of coronary artery bypass graft(s) without angina pectoris: Secondary | ICD-10-CM

## 2020-11-14 DIAGNOSIS — E785 Hyperlipidemia, unspecified: Secondary | ICD-10-CM | POA: Diagnosis not present

## 2020-11-14 NOTE — Progress Notes (Signed)
Cardiology Office Note:    Date:  11/14/2020   ID:  Adrian [name], DOB 09-17-1950, MRN [id number]  PCP:  Maurice Small, MD  Cardiologist:  Thurmon Fair, MD   Referring MD: Maurice Small, MD   No chief complaint on file. CAD  History of Present Illness:    Adrian [name] is a 70 y.o. male with a hx of moderate to severe obesity and has hypertension and hyperlipidemia and multivessel coronary disease for which he underwent bypass surgery in September 2011 (LIMA to LAD, SVG to ramus intermediate, SVG to circumflex marginal, SVG to posterior descending branch of the right coronary artery, EVH from a left side by Dr. Donata Clay ).  Lost some weight down to 221 and felt well but "looked sick". Has gained some back, now BMI>35 again. Despite this, labs are OK with A1c 6.1% and LDL 73. Exercises 30-45' a day, 7 days a week (stationary bike and hand bike). Able to run up the stairs.  The patient specifically denies any chest pain at rest exertion, dyspnea at rest or with exertion, orthopnea, paroxysmal nocturnal dyspnea, syncope, palpitations, focal neurological deficits, intermittent claudication, lower extremity edema, unexplained weight gain, cough, hemoptysis or wheezing.  He had a normal nuclear stress test in April 2018.  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  His ECG shows chronic T wave inversion in the precordial leads V1-V3.    Past Medical History:  Diagnosis Date  . CAD (coronary artery disease)   . Chest wall pain following surgery   . Diabetes mellitus without complication (HCC)    pt states that he is not a diabetic; and does not take any diabetes medication  . Dyspnea    went to ED for SOB in january  . Hyperlipidemia   . Hypertension   . Myocardial infarction (HCC)    2011  .  Pleural effusion, left     Past Surgical History:  Procedure Laterality Date  . CARDIAC CATHETERIZATION  04/29/10   LEFT MAIN-FAIRLY SHORT VESSEL, FREE OF DISEASE. LAD 70-90%. A CERTAIN SEGMENT APPEARS SLIGHTLY ULCERATED, THERE MAYBE ACTIVE RUPTURED PLAQUE. RAMUS INTERMEDIUS 95% IN THE PROXIMAL THIRD. LEFT CX 70-80% STENOSIS IN THE MID OM. RCA 40-50% OSTIAL STENOSIS. REFERRED FOR CABG.  . CAROTID DUPLEX Bilateral 04/29/10   NO ICA STENOSIS BIL.+  . CORONARY ARTERY BYPASS GRAFT  04/30/10   X4  . Coronary artery bypass grafting  05/01/2010  . lumbar laminectomy in 2006 by Dr. Pennie Banter    . LUMBAR LAMINECTOMY/DECOMPRESSION MICRODISCECTOMY Left 12/17/2016   Procedure: Microlumbar decompression L2-L3;  Surgeon: Jene Every, MD;  Location: [facility];  Service: Orthopedics;  Laterality: Left;  Requests for 2 hrs  . NUCLEAR STRESS TEST N/A 03/01/10   NORMAL PERFUSION PATTERN IN ALL REGIONS. EF 69%.NL MYOCARDIAL PERFUSION STUDY.  . TRANSTHORACIC ECHOCARDIOGRAM N/A 04/27/10   LV SIZE IS NORMAL. SYSTOLIC FUNC IS NORMAL.    Current Medications: Current Meds  Medication Sig  . aspirin EC 81 MG tablet Take 81 mg by mouth daily.  Marland Kitchen atorvastatin (LIPITOR) 40 MG tablet TAKE 1 TABLET BY MOUTH DAILY  . hydrochlorothiazide (MICROZIDE) 12.5 MG capsule TAKE 1 CAPSULE BY MOUTH DAILY  . losartan (COZAAR) 100 MG tablet Take 1 tablet (100 mg total) by mouth daily.     Allergies:  Patient has no known allergies.   Social History   Socioeconomic History  . Marital status: Married    Spouse name: Not on file  . Number of children: Not on file  . Years of education: Not on file  . Highest education level: Not on file  Occupational History  . Not on file  Tobacco Use  . Smoking status: Never Smoker  . Smokeless tobacco: Never Used  Substance and Sexual Activity  . Alcohol use: Yes    Comment: 3x a week  . Drug use: No  . Sexual activity: Not on file  Other Topics Concern  . Not on file  Social History  Narrative  . Not on file   Social Determinants of Health   Financial Resource Strain: Not on file  Food Insecurity: Not on file  Transportation Needs: Not on file  Physical Activity: Not on file  Stress: Not on file  Social Connections: Not on file     Family History: The patient's Family history is unknown by patient.  ROS:   Please see the history of present illness.    All other systems reviewed and are negative  EKGs/Labs/Other Studies Reviewed:    The following studies were reviewed today:   EKG:  EKG is ordered today.  It shows NSR, 1st deg AVB, LAFB, no new repol abnormalities.  Recent Labs: No results found for requested labs within last 8760 hours.  Recent Lipid Panel    Component Value Date/Time   CHOL (H) 04/27/2010 0747    223        ATP III CLASSIFICATION:  <200     mg/dL   Desirable  967-591  mg/dL   Borderline High  >=638    mg/dL   High          TRIG 96 04/27/2010 0747   HDL 55 04/27/2010 0747   CHOLHDL 4.1 04/27/2010 0747   VLDL 19 04/27/2010 0747   LDLCALC (H) 04/27/2010 0747    149        Total Cholesterol/HDL:CHD Risk Coronary Heart Disease Risk Table                     Men   Women  1/2 Average Risk   3.4   3.3  Average Risk       5.0   4.4  2 X Average Risk   9.6   7.1  3 X Average Risk  23.4   11.0        Use the calculated Patient Ratio above and the CHD Risk Table to determine the patient's CHD Risk.        ATP III CLASSIFICATION (LDL):  <100     mg/dL   Optimal  466-599  mg/dL   Near or Above                    Optimal  130-159  mg/dL   Borderline  357-017  mg/dL   High  >793     mg/dL   Very High    May 20, 2019 Cholesterol 141, HDL 39, LDL 65, triglycerides 187  May 21, 2020 Cholesterol 154, HDL 49, LDL 73, triglycerides 191  Physical Exam:    VS:  BP (!) 160/81   Pulse (!) 54   Ht 5\' 9"  (1.753 m)   Wt 241 lb 12.8 oz (109.7 kg)   BMI 35.71 kg/m     Wt Readings from Last 3 Encounters:  11/14/20  241 lb  12.8 oz (109.7 kg)  10/21/19 256 lb (116.1 kg)  10/21/18 249 lb 9.6 oz (113.2 kg)     General: Alert, oriented x3, no distress, obese Head: no evidence of trauma, PERRL, EOMI, no exophtalmos or lid lag, no myxedema, no xanthelasma; normal ears, nose and oropharynx Neck: normal jugular venous pulsations and no hepatojugular reflux; brisk carotid pulses without delay and no carotid bruits Chest: clear to auscultation, no signs of consolidation by percussion or palpation, normal fremitus, symmetrical and full respiratory excursions Cardiovascular: normal position and quality of the apical impulse, regular rhythm, normal first and second heart sounds, no murmurs, rubs or gallops Abdomen: no tenderness or distention, no masses by palpation, no abnormal pulsatility or arterial bruits, normal bowel sounds, no hepatosplenomegaly Extremities: no clubbing, cyanosis or edema; 2+ radial, ulnar and brachial pulses bilaterally; 2+ right femoral, posterior tibial and dorsalis pedis pulses; 2+ left femoral, posterior tibial and dorsalis pedis pulses; no subclavian or femoral bruits Neurological: grossly nonfocal Psych: Normal mood and affect    ASSESSMENT:    1. Coronary artery disease involving coronary bypass graft of native heart without angina pectoris   2. Essential hypertension   3. Dyslipidemia   4. Severe obesity (BMI 35.0-39.9) with comorbidity (HCC)    PLAN:    In order of problems listed above:  1. CAD s/p CABG: Exercise regularly without angina. Offered treadmill test for monitoring, but he declined.  Low risk nuclear stress test in 2018.  The focus is on risk factor modification.  Continue statin and aspirin, no beta blocker due to bradycardia.  2. HTN: High today, but OK at home. Will send me some recordings from home. No change today. 3. HLP: All lipid parameters are acceptable. 4. Obesity: Advised weight loss again. Better to "look sick" than "be sick" in my opinion.   Medication  Adjustments/Labs and Tests Ordered: Current medicines are reviewed at length with the patient today.  Concerns regarding medicines are outlined above.  No orders of the defined types were placed in this encounter.  No orders of the defined types were placed in this encounter.  There are no Patient Instructions on file for this visit.  Signed, Thurmon Fair, MD  11/14/2020 8:23 AM    [facility]

## 2020-11-14 NOTE — Patient Instructions (Signed)
Medication Instructions:  No changes *If you need a refill on your cardiac medications before your next appointment, please call your pharmacy*   Lab Work: None ordered If you have labs (blood work) drawn today and your tests are completely normal, you will receive your results only by: Marland Kitchen MyChart Message (if you have MyChart) OR . A paper copy in the mail If you have any lab test that is abnormal or we need to change your treatment, we will call you to review the results.   Testing/Procedures: None ordered   Follow-Up: At [facility], you and your health needs are our priority.  As part of our continuing mission to provide you with exceptional heart care, we have created designated Provider Care Teams.  These Care Teams include your primary Cardiologist (physician) and Advanced Practice Providers (APPs -  Physician Assistants and Nurse Practitioners) who all work together to provide you with the care you need, when you need it.  We recommend signing up for the patient portal called "MyChart".  Sign up information is provided on this After Visit Summary.  MyChart is used to connect with patients for Virtual Visits (Telemedicine).  Patients are able to view lab/test results, encounter notes, upcoming appointments, etc.  Non-urgent messages can be sent to your provider as well.   To learn more about what you can do with MyChart, go to [email or web address].    Your next appointment:   12 month(s)  The format for your next appointment:   In Person  Provider:   Follow up with Dr. Royann Shivers

## 2020-11-16 DIAGNOSIS — H5203 Hypermetropia, bilateral: Secondary | ICD-10-CM | POA: Diagnosis not present

## 2020-11-16 DIAGNOSIS — H524 Presbyopia: Secondary | ICD-10-CM | POA: Diagnosis not present

## 2020-11-19 DIAGNOSIS — E1169 Type 2 diabetes mellitus with other specified complication: Secondary | ICD-10-CM | POA: Diagnosis not present

## 2020-11-19 DIAGNOSIS — I2510 Atherosclerotic heart disease of native coronary artery without angina pectoris: Secondary | ICD-10-CM | POA: Diagnosis not present

## 2020-11-19 DIAGNOSIS — I119 Hypertensive heart disease without heart failure: Secondary | ICD-10-CM | POA: Diagnosis not present

## 2020-11-19 DIAGNOSIS — E7800 Pure hypercholesterolemia, unspecified: Secondary | ICD-10-CM | POA: Diagnosis not present

## 2020-12-03 ENCOUNTER — Other Ambulatory Visit: Payer: Self-pay | Admitting: Cardiovascular Disease

## 2020-12-03 NOTE — Telephone Encounter (Signed)
Rx has been sent to the pharmacy electronically. ° °

## 2021-01-15 ENCOUNTER — Other Ambulatory Visit: Payer: Self-pay | Admitting: Cardiology

## 2021-03-30 DIAGNOSIS — M79662 Pain in left lower leg: Secondary | ICD-10-CM | POA: Diagnosis not present

## 2021-03-30 DIAGNOSIS — M79605 Pain in left leg: Secondary | ICD-10-CM | POA: Diagnosis not present

## 2021-03-30 DIAGNOSIS — R202 Paresthesia of skin: Secondary | ICD-10-CM | POA: Diagnosis not present

## 2021-03-30 DIAGNOSIS — G9009 Other idiopathic peripheral autonomic neuropathy: Secondary | ICD-10-CM | POA: Diagnosis not present

## 2021-05-28 DIAGNOSIS — E7800 Pure hypercholesterolemia, unspecified: Secondary | ICD-10-CM | POA: Diagnosis not present

## 2021-05-28 DIAGNOSIS — E1169 Type 2 diabetes mellitus with other specified complication: Secondary | ICD-10-CM | POA: Diagnosis not present

## 2021-05-28 DIAGNOSIS — Z125 Encounter for screening for malignant neoplasm of prostate: Secondary | ICD-10-CM | POA: Diagnosis not present

## 2021-05-28 DIAGNOSIS — I2510 Atherosclerotic heart disease of native coronary artery without angina pectoris: Secondary | ICD-10-CM | POA: Diagnosis not present

## 2021-05-28 DIAGNOSIS — Z0000 Encounter for general adult medical examination without abnormal findings: Secondary | ICD-10-CM | POA: Diagnosis not present

## 2021-05-28 DIAGNOSIS — I119 Hypertensive heart disease without heart failure: Secondary | ICD-10-CM | POA: Diagnosis not present

## 2021-10-07 ENCOUNTER — Other Ambulatory Visit: Payer: Self-pay | Admitting: Cardiovascular Disease

## 2021-11-19 DIAGNOSIS — I2510 Atherosclerotic heart disease of native coronary artery without angina pectoris: Secondary | ICD-10-CM | POA: Diagnosis not present

## 2021-11-19 DIAGNOSIS — E7800 Pure hypercholesterolemia, unspecified: Secondary | ICD-10-CM | POA: Diagnosis not present

## 2021-11-19 DIAGNOSIS — I119 Hypertensive heart disease without heart failure: Secondary | ICD-10-CM | POA: Diagnosis not present

## 2021-11-19 DIAGNOSIS — E1169 Type 2 diabetes mellitus with other specified complication: Secondary | ICD-10-CM | POA: Diagnosis not present

## 2021-12-02 ENCOUNTER — Other Ambulatory Visit: Payer: Self-pay | Admitting: Cardiovascular Disease

## 2022-01-02 ENCOUNTER — Encounter: Payer: Self-pay | Admitting: Cardiovascular Disease

## 2022-01-02 ENCOUNTER — Ambulatory Visit: Payer: Medicare Other | Admitting: Cardiovascular Disease

## 2022-01-02 VITALS — BP 166/84 | HR 58 | Ht 70.0 in | Wt 241.0 lb

## 2022-01-02 DIAGNOSIS — R030 Elevated blood-pressure reading, without diagnosis of hypertension: Secondary | ICD-10-CM

## 2022-01-02 DIAGNOSIS — E782 Mixed hyperlipidemia: Secondary | ICD-10-CM | POA: Diagnosis not present

## 2022-01-02 DIAGNOSIS — I10 Essential (primary) hypertension: Secondary | ICD-10-CM

## 2022-01-02 DIAGNOSIS — I25810 Atherosclerosis of coronary artery bypass graft(s) without angina pectoris: Secondary | ICD-10-CM | POA: Diagnosis not present

## 2022-01-02 DIAGNOSIS — I459 Conduction disorder, unspecified: Secondary | ICD-10-CM

## 2022-01-02 DIAGNOSIS — I771 Stricture of artery: Secondary | ICD-10-CM

## 2022-01-02 NOTE — Progress Notes (Signed)
Cardiology Office Note:    Date:  01/06/2022   ID:  Adrian [name], DOB Nov 18, 1950, MRN [id number]  PCP:  Ollen Bowl, MD  Cardiologist:  Thurmon Fair, MD   Referring MD: Maurice Small, MD   Chief Complaint  Patient presents with   Coronary Artery Disease  CAD  History of Present Illness:    Adrian [name] is a 71 y.o. male with a hx of moderate to severe obesity and has hypertension and hyperlipidemia and multivessel coronary disease for which he underwent bypass surgery in September 2011 (LIMA to LAD, SVG to ramus intermediate, SVG to circumflex marginal, SVG to posterior descending branch of the right coronary artery, EVH from a left side by Dr. Donata Clay ).  He is maintaining his weight from a couple of years ago at about 240 pounds, BMI around 34.  When he lost additional weight down to 221 pounds a couple of years ago he "looked sick"./Obesity, he has reasonably good metabolic control.  His hemoglobin A1c is 6.3% and the HDL is 42.  His most recent LDL from October 2022 was elevated at 82 (target less than 70).  His triglycerides are chronically mildly elevated, most recently 260, higher than last year.  He reports compliance with his statin.  He has a slight difference in blood pressure between his left upper extremity (higher, today 165/85) versus the right upper extremity (150/70).  He does not have intermittent claudication of the upper extremities.  He continues to exercise between half an hour and an hour a day most days of the week. The patient specifically denies any chest pain at rest exertion, dyspnea at rest or with exertion, orthopnea, paroxysmal nocturnal dyspnea, syncope, palpitations, focal neurological deficits, intermittent claudication, lower extremity edema, unexplained weight gain, cough, hemoptysis or wheezing.   He had a normal nuclear stress test in April 2018.  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  His ECG shows chronic T wave inversion in the precordial leads V1-V3.    Past Medical History:  Diagnosis Date   CAD (coronary artery disease)    Chest wall pain following surgery    Diabetes mellitus without complication (HCC)    pt states that he is not a diabetic; and does not take any diabetes medication   Dyspnea    went to ED for SOB in january   Hyperlipidemia    Hypertension    Myocardial infarction [facility])    2011   Pleural effusion, left     Past Surgical History:  Procedure Laterality Date   CARDIAC CATHETERIZATION  04/29/10   LEFT MAIN-FAIRLY SHORT VESSEL, FREE OF DISEASE. LAD 70-90%. A CERTAIN SEGMENT APPEARS SLIGHTLY ULCERATED, THERE MAYBE ACTIVE RUPTURED PLAQUE. RAMUS INTERMEDIUS 95% IN THE PROXIMAL THIRD. LEFT CX 70-80% STENOSIS IN THE MID OM. RCA 40-50% OSTIAL STENOSIS. REFERRED FOR CABG.   CAROTID DUPLEX Bilateral 04/29/10   NO ICA STENOSIS BIL.+   CORONARY ARTERY BYPASS GRAFT  04/30/10   X4   Coronary artery bypass grafting  05/01/2010   lumbar laminectomy in 2006 by Dr. Charlesetta Ivory LAMINECTOMY/DECOMPRESSION MICRODISCECTOMY Left 12/17/2016   Procedure: Microlumbar decompression L2-L3;  Surgeon: Jene Every, MD;  Location: [facility];  Service: Orthopedics;  Laterality: Left;  Requests for 2 hrs   NUCLEAR STRESS TEST  N/A 03/01/10   NORMAL PERFUSION PATTERN IN ALL REGIONS. EF 69%.NL MYOCARDIAL PERFUSION STUDY.   TRANSTHORACIC ECHOCARDIOGRAM N/A 04/27/10   LV SIZE IS NORMAL. SYSTOLIC FUNC IS NORMAL.    Current Medications: Current Meds  Medication Sig   aspirin EC 81 MG tablet Take 81 mg by mouth daily.   atorvastatin (LIPITOR) 40 MG tablet TAKE 1 TABLET BY MOUTH DAILY   hydrochlorothiazide (MICROZIDE) 12.5 MG capsule TAKE 1 CAPSULE BY MOUTH DAILY   losartan (COZAAR) 100 MG tablet Take 1 tablet (100 mg total) by mouth daily.      Allergies:   Patient has no known allergies.   Social History   Socioeconomic History   Marital status: Married    Spouse name: Not on file   Number of children: Not on file   Years of education: Not on file   Highest education level: Not on file  Occupational History   Not on file  Tobacco Use   Smoking status: Never   Smokeless tobacco: Never  Substance and Sexual Activity   Alcohol use: Yes    Comment: 3x a week   Drug use: No   Sexual activity: Not on file  Other Topics Concern   Not on file  Social History Narrative   Not on file   Social Determinants of Health   Financial Resource Strain: Not on file  Food Insecurity: Not on file  Transportation Needs: Not on file  Physical Activity: Not on file  Stress: Not on file  Social Connections: Not on file     Family History: The patient's Family history is unknown by patient.  ROS:   Please see the history of present illness.    All other systems reviewed and are negative  EKGs/Labs/Other Studies Reviewed:    The following studies were reviewed today:   EKG:  EKG is ordered today.  It shows normal sinus rhythm with first-degree AV block (PR 256 ms), incomplete right bundle branch block (QRS 114 ms) and left axis deviation almost meeting criteria for left anterior fascicular block (-40 degrees).  No ischemic repolarization normalities, QTc 451 ms  Recent Labs: No results found for requested labs within last 8760 hours.  11/19/2021 Hemoglobin A1c 6.3%, creatinine 0.91, potassium 3.8  Recent Lipid Panel    Component Value Date/Time   CHOL (H) 04/27/2010 0747    223        ATP III CLASSIFICATION:  <200     mg/dL   Desirable  [id number]  mg/dL   Borderline High  >=119>=240    mg/dL   High          TRIG 96 04/27/2010 0747   HDL 55 04/27/2010 0747   CHOLHDL 4.1 04/27/2010 0747   VLDL 19 04/27/2010 0747   LDLCALC (H) 04/27/2010 0747    149        Total Cholesterol/HDL:CHD Risk Coronary Heart Disease Risk  Table                     Men   Women  1/2 Average Risk   3.4   3.3  Average Risk       5.0   4.4  2 X Average Risk   9.6   7.1  3 X Average Risk  23.4   11.0        Use the calculated Patient Ratio above and the CHD Risk Table to determine the patient's CHD Risk.        ATP  III CLASSIFICATION (LDL):  <100     mg/dL   Optimal  161-096  mg/dL   Near or Above                    Optimal  130-159  mg/dL   Borderline  045-409  mg/dL   High  >811     mg/dL   Very High    May 20, 2019 Cholesterol 141, HDL 39, LDL 65, triglycerides 187  May 21, 2020 Cholesterol 154, HDL 49, LDL 73, triglycerides 191  May 28, 2021 Cholesterol 167, HDL 42, LDL 82, triglycerides 260  Physical Exam:    VS:  BP (!) 166/84   Pulse (!) 58   Ht 5\' 10"  (1.778 m)   Wt 241 lb (109.3 kg)   SpO2 97%   BMI 34.58 kg/m     Wt Readings from Last 3 Encounters:  01/02/22 241 lb (109.3 kg)  11/14/20 241 lb 12.8 oz (109.7 kg)  10/21/19 256 lb (116.1 kg)      General: Alert, oriented x3, no distress, he is obese, but also appears quite fit and muscular for his age Head: no evidence of trauma, PERRL, EOMI, no exophtalmos or lid lag, no myxedema, no xanthelasma; normal ears, nose and oropharynx Neck: normal jugular venous pulsations and no hepatojugular reflux; brisk carotid pulses without delay and no carotid bruits Chest: clear to auscultation, no signs of consolidation by percussion or palpation, normal fremitus, symmetrical and full respiratory excursions Cardiovascular: normal position and quality of the apical impulse, regular rhythm, normal first and second heart sounds, no murmurs, rubs or gallops Abdomen: no tenderness or distention, no masses by palpation, no abnormal pulsatility or arterial bruits, normal bowel sounds, no hepatosplenomegaly Extremities: no clubbing, cyanosis or edema; 2+ radial, ulnar and brachial pulses bilaterally; 2+ right femoral, posterior tibial and dorsalis pedis  pulses; 2+ left femoral, posterior tibial and dorsalis pedis pulses; no subclavian or femoral bruits Neurological: grossly nonfocal Psych: Normal mood and affect     ASSESSMENT:    1. Coronary artery disease involving coronary bypass graft of native heart without angina pectoris   2. Essential hypertension   3. Mixed hyperlipidemia   4. Severe obesity (BMI 35.0-39.9) with comorbidity (HCC)   5. Abnormal cardiac conduction   6. Stenosis of right subclavian artery (HCC)   7. Situational hypertension     PLAN:    In order of problems listed above:  CAD s/p CABG: Continues to exercise regularly without complaints of angina or dyspnea.  Most recent functional study was a low risk nuclear stress test in 2018.  The focus is on risk factor modification.  Continue statin and aspirin, no beta blocker due to bradycardia.  HTN: As before, he reports that his blood pressure is always high in the cardiology office.  A few months ago in PCPs office his blood pressure was 126/70.  Asked him to keep an eye on his blood pressure at home. HLP: LDL is higher than target, for the first time in a while.  Target LDL less than 70.  Discussed pain closer attention to weight loss via carbohydrate restriction, avoiding saturated fat, increasing intake of unsaturated fat and lean protein.  We will have labs repeated this fall.  If LDL still greater than 70, recommend increasing the dose of atorvastatin to 80 mg daily. Obesity: Advised weight loss again. Better to "look sick" than "be sick" in my opinion.  Hemoglobin A1c remains in prediabetes range without medications. 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  No symptoms of high-grade AV block.  Avoid beta-blockers or other medications with negative chronotropic effect. Unequal blood pressures between the right and left upper extremity suggest that he may have right subclavian  stenosis.  This is asymptomatic (no vertebral steal syndrome, no claudication, no bypasses coming off the right subclavian).  Always check blood pressure in the left upper extremity.    Medication Adjustments/Labs and Tests Ordered: Current medicines are reviewed at length with the patient today.  Concerns regarding medicines are outlined above.  Orders Placed This Encounter  Procedures   EKG 12-Lead   No orders of the defined types were placed in this encounter.  Patient Instructions  Medication Instructions:  No changes *If you need a refill on your cardiac medications before your next appointment, please call your pharmacy*   Lab Work: None ordered If you have labs (blood work) drawn today and your tests are completely normal, you will receive your results only by: MyChart Message (if you have MyChart) OR A paper copy in the mail If you have any lab test that is abnormal or we need to change your treatment, we will call you to review the results.   Testing/Procedures: None ordered   Follow-Up: At [facility], you and your health needs are our priority.  As part of our continuing mission to provide you with exceptional heart care, we have created designated Provider Care Teams.  These Care Teams include your primary Cardiologist (physician) and Advanced Practice Providers (APPs -  Physician Assistants and Nurse Practitioners) who all work together to provide you with the care you need, when you need it.  We recommend signing up for the patient portal called "MyChart".  Sign up information is provided on this After Visit Summary.  MyChart is used to connect with patients for Virtual Visits (Telemedicine).  Patients are able to view lab/test results, encounter notes, upcoming appointments, etc.  Non-urgent messages can be sent to your provider as well.   To learn more about what you can do with MyChart, go to [email or web address].    Your next appointment:   12  month(s)  The format for your next appointment:   In Person  Provider:   Thurmon Fair, MD {   Important Information About Sugar        Signed, Thurmon Fair, MD  01/06/2022 1:49 PM    [facility]

## 2022-01-02 NOTE — Patient Instructions (Signed)

## 2022-01-06 ENCOUNTER — Encounter: Payer: Self-pay | Admitting: Cardiovascular Disease

## 2022-05-26 ENCOUNTER — Other Ambulatory Visit: Payer: Self-pay | Admitting: Cardiovascular Disease

## 2022-06-03 DIAGNOSIS — E1165 Type 2 diabetes mellitus with hyperglycemia: Secondary | ICD-10-CM | POA: Diagnosis not present

## 2022-06-03 DIAGNOSIS — I119 Hypertensive heart disease without heart failure: Secondary | ICD-10-CM | POA: Diagnosis not present

## 2022-06-03 DIAGNOSIS — Z125 Encounter for screening for malignant neoplasm of prostate: Secondary | ICD-10-CM | POA: Diagnosis not present

## 2022-06-03 DIAGNOSIS — I2510 Atherosclerotic heart disease of native coronary artery without angina pectoris: Secondary | ICD-10-CM | POA: Diagnosis not present

## 2022-06-03 DIAGNOSIS — E1169 Type 2 diabetes mellitus with other specified complication: Secondary | ICD-10-CM | POA: Diagnosis not present

## 2022-06-03 DIAGNOSIS — Z0000 Encounter for general adult medical examination without abnormal findings: Secondary | ICD-10-CM | POA: Diagnosis not present

## 2022-06-03 DIAGNOSIS — E7800 Pure hypercholesterolemia, unspecified: Secondary | ICD-10-CM | POA: Diagnosis not present

## 2022-07-07 ENCOUNTER — Other Ambulatory Visit: Payer: Self-pay | Admitting: Cardiovascular Disease

## 2022-07-17 DIAGNOSIS — L509 Urticaria, unspecified: Secondary | ICD-10-CM | POA: Diagnosis not present

## 2022-07-17 DIAGNOSIS — R21 Rash and other nonspecific skin eruption: Secondary | ICD-10-CM | POA: Diagnosis not present

## 2022-09-10 DIAGNOSIS — M25512 Pain in left shoulder: Secondary | ICD-10-CM | POA: Diagnosis not present

## 2022-09-10 DIAGNOSIS — E1169 Type 2 diabetes mellitus with other specified complication: Secondary | ICD-10-CM | POA: Diagnosis not present

## 2022-09-10 DIAGNOSIS — G8929 Other chronic pain: Secondary | ICD-10-CM | POA: Diagnosis not present

## 2022-11-17 ENCOUNTER — Telehealth: Payer: Self-pay | Admitting: Cardiovascular Disease

## 2022-11-17 NOTE — Telephone Encounter (Signed)
Spoke to patient's wife.She stated husband has been having sob,chest pain,dizziness off and on since last weekend.Stated no sob or chest pain at present.Stated he just don't feel good this morning.Appointment scheduled with Dr.Croitoru 4/5 at 11:30 am.Advised if symptoms worsen go to ED.

## 2022-11-17 NOTE — Telephone Encounter (Signed)
Pt c/o Shortness Of Breath: STAT if SOB developed within the last 24 hours or pt is noticeably SOB on the phone  1. Are you currently SOB (can you hear that pt is SOB on the phone)? No   2. How long have you been experiencing SOB? A few days  3. Are you SOB when sitting or when up moving around? Both   4. Are you currently experiencing any other symptoms? Dizzy spells, hot flashes, pre-syncope

## 2022-11-21 ENCOUNTER — Ambulatory Visit: Payer: Medicare Other | Attending: Cardiovascular Disease | Admitting: Cardiovascular Disease

## 2022-11-21 ENCOUNTER — Encounter: Payer: Self-pay | Admitting: Cardiovascular Disease

## 2022-11-21 VITALS — BP 172/78 | HR 60 | Ht 70.0 in | Wt 249.2 lb

## 2022-11-21 DIAGNOSIS — E782 Mixed hyperlipidemia: Secondary | ICD-10-CM

## 2022-11-21 DIAGNOSIS — I25810 Atherosclerosis of coronary artery bypass graft(s) without angina pectoris: Secondary | ICD-10-CM | POA: Diagnosis not present

## 2022-11-21 DIAGNOSIS — E118 Type 2 diabetes mellitus with unspecified complications: Secondary | ICD-10-CM

## 2022-11-21 DIAGNOSIS — I10 Essential (primary) hypertension: Secondary | ICD-10-CM | POA: Diagnosis not present

## 2022-11-21 DIAGNOSIS — I459 Conduction disorder, unspecified: Secondary | ICD-10-CM

## 2022-11-21 DIAGNOSIS — I440 Atrioventricular block, first degree: Secondary | ICD-10-CM

## 2022-11-21 NOTE — Patient Instructions (Signed)
Medication Instructions:  No changes *If you need a refill on your cardiac medications before your next appointment, please call your pharmacy*  Follow-Up: At [facility], you and your health needs are our priority.  As part of our continuing mission to provide you with exceptional heart care, we have created designated Provider Care Teams.  These Care Teams include your primary Cardiologist (physician) and Advanced Practice Providers (APPs -  Physician Assistants and Nurse Practitioners) who all work together to provide you with the care you need, when you need it.  We recommend signing up for the patient portal called "MyChart".  Sign up information is provided on this After Visit Summary.  MyChart is used to connect with patients for Virtual Visits (Telemedicine).  Patients are able to view lab/test results, encounter notes, upcoming appointments, etc.  Non-urgent messages can be sent to your provider as well.   To learn more about what you can do with MyChart, go to [email or web address].    Your next appointment:   1 year(s)  Provider:   Mihai Croitoru, MD     

## 2022-11-21 NOTE — Progress Notes (Signed)
Cardiology Office Note:    Date:  11/21/2022   ID:  Adrian [name], DOB 02/19/1951, MRN [id number]  PCP:  Ollen BowlPahwani, Rinka R, MD  Cardiologist:  Thurmon FairMihai Maleka Contino, MD   Referring MD: Ollen BowlPahwani, Rinka R, MD   No chief complaint on file. CAD  History of Present Illness:    Adrian GrammesSteve A [name] is a 72 y.o. male with a hx of moderate to severe obesity and has hypertension and hyperlipidemia and multivessel coronary disease for which he underwent bypass surgery in September 2011 (LIMA to LAD, SVG to ramus intermediate, SVG to circumflex marginal, SVG to posterior descending branch of the right coronary artery, EVH from a left side by Dr. Donata ClayVan Trigt ).  He has gained a little more weight, 8 pounds since last year.  The lowest weight he's achieved since bypass surgery was 221 pounds and he felt that he "looked sick ".  The patient specifically denies any chest pain at rest or with exertion, dyspnea at rest or with exertion, orthopnea, paroxysmal nocturnal dyspnea, syncope, palpitations, focal neurological deficits, intermittent claudication, lower extremity edema, unexplained weight gain, cough, hemoptysis or wheezing.  He continues to exercise between half an hour an hour daily on most days of the week, typically on his bicycle.  Most recent LDL cholesterol was okay at 75, almost at target.  Most recent hemoglobin A1c was elevated at 6.5%, but he is not on any medications for diabetes mellitus.  He has been keeping a detailed record of his blood pressure at home and actually is not any significant difference between the right and left arm.  I also obtained equal blood pressures in the office today.  He tends to have a higher blood pressure in the doctor's office, at home earlier today his blood pressure was 126/56 in both arms.  Gabapentin no longer works for his restless leg syndrome and he would like to stop it.  He will wean it off.  He had a normal nuclear stress test in April 2018.  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  His ECG shows chronic T wave inversion in the precordial leads V1-V3.    Past Medical History:  Diagnosis Date   CAD (coronary artery disease)    Chest wall pain following surgery    Diabetes mellitus without complication    pt states that he is not a diabetic; and does not take any diabetes medication   Dyspnea    went to ED for SOB in january   Hyperlipidemia    Hypertension    Myocardial infarction    2011   Pleural effusion, left     Past Surgical History:  Procedure Laterality Date   CARDIAC CATHETERIZATION  04/29/10   LEFT MAIN-FAIRLY SHORT VESSEL, FREE OF DISEASE. LAD 70-90%. A CERTAIN SEGMENT APPEARS SLIGHTLY ULCERATED, THERE MAYBE ACTIVE RUPTURED PLAQUE. RAMUS INTERMEDIUS 95% IN THE PROXIMAL THIRD. LEFT CX 70-80% STENOSIS IN THE MID OM. RCA 40-50% OSTIAL STENOSIS. REFERRED FOR CABG.   CAROTID DUPLEX Bilateral 04/29/10   NO ICA STENOSIS BIL.+   CORONARY ARTERY BYPASS GRAFT  04/30/10   X4   Coronary artery bypass grafting  05/01/2010   lumbar laminectomy in 2006 by Dr. Charlesetta IvoryKritze     LUMBAR LAMINECTOMY/DECOMPRESSION MICRODISCECTOMY Left 12/17/2016   Procedure: Microlumbar decompression L2-L3;  Surgeon: Tinnie GensJeffrey  Shelle IronBeane, MD;  Location: [facility];  Service: Orthopedics;  Laterality: Left;  Requests for 2 hrs   NUCLEAR STRESS TEST N/A 03/01/10   NORMAL PERFUSION PATTERN IN ALL REGIONS. EF 69%.NL MYOCARDIAL PERFUSION STUDY.   TRANSTHORACIC ECHOCARDIOGRAM N/A 04/27/10   LV SIZE IS NORMAL. SYSTOLIC FUNC IS NORMAL.    Current Medications: Current Meds  Medication Sig   aspirin EC 81 MG tablet Take 81 mg by mouth daily.   atorvastatin (LIPITOR) 40 MG tablet TAKE 1 TABLET BY MOUTH DAILY   gabapentin (NEURONTIN) 600 MG tablet Take 600 mg by mouth daily.   hydrochlorothiazide (MICROZIDE)  12.5 MG capsule TAKE 1 CAPSULE BY MOUTH DAILY   losartan (COZAAR) 100 MG tablet Take 1 tablet (100 mg total) by mouth daily.   Polyethylene Glycol 3350 (MIRALAX PO) Take by mouth as needed.     Allergies:   Patient has no known allergies.   Social History   Socioeconomic History   Marital status: Married    Spouse name: Not on file   Number of children: Not on file   Years of education: Not on file   Highest education level: Not on file  Occupational History   Not on file  Tobacco Use   Smoking status: Never   Smokeless tobacco: Never  Substance and Sexual Activity   Alcohol use: Yes    Comment: 3x a week   Drug use: No   Sexual activity: Not on file  Other Topics Concern   Not on file  Social History Narrative   Not on file   Social Determinants of Health   Financial Resource Strain: Not on file  Food Insecurity: Not on file  Transportation Needs: Not on file  Physical Activity: Not on file  Stress: Not on file  Social Connections: Not on file     Family History: The patient's Family history is unknown by patient.  ROS:   Please see the history of present illness.    All other systems reviewed and are negative  EKGs/Labs/Other Studies Reviewed:    The following studies were reviewed today:   EKG:  EKG is ordered today.  Shows sinus rhythm with first-degree AV block (PR interval 222 ms), left axis deviation, incomplete right bundle branch block (QRS 114 ms), not quite meeting criteria for left anterior fascicular block, chronic T wave inversion leads V1-V3, no new ischemic repolarization abnormalities, QTc 440 ms .  Virtually identical with last year's tracing. Recent Labs: No results found for requested labs within last 365 days.  11/19/2021 Hemoglobin A1c 6.3%, creatinine 0.91, potassium 3.8  Recent Lipid Panel    Component Value Date/Time   CHOL (H) 04/27/2010 0747    223        ATP III CLASSIFICATION:  <200     mg/dL   Desirable  161-096200-239  mg/dL    Borderline High  >=045>=240    mg/dL   High          TRIG 96 04/27/2010 0747   HDL 55 04/27/2010 0747   CHOLHDL 4.1 04/27/2010 0747   VLDL 19 04/27/2010 0747   LDLCALC (H) 04/27/2010 0747    149        Total Cholesterol/HDL:CHD Risk Coronary Heart Disease Risk Table                     Men   Women  1/2 Average Risk   3.4   3.3  Average Risk  5.0   4.4  2 X Average Risk   9.6   7.1  3 X Average Risk  23.4   11.0        Use the calculated Patient Ratio above and the CHD Risk Table to determine the patient's CHD Risk.        ATP III CLASSIFICATION (LDL):  <100     mg/dL   Optimal  578-469  mg/dL   Near or Above                    Optimal  130-159  mg/dL   Borderline  629-528  mg/dL   High  >413     mg/dL   Very High    May 20, 2019 Cholesterol 141, HDL 39, LDL 65, triglycerides 187  May 21, 2020 Cholesterol 154, HDL 49, LDL 73, triglycerides 191  May 28, 2021 Cholesterol 167, HDL 42, LDL 82, triglycerides 260  June 03, 2022 Cholesterol 167, HDL 45, LDL 75, triglycerides 290 Hemoglobin A1c 6.5%, creatinine 0.9, potassium 4.1, ALT 19  Physical Exam:    VS:  BP (!) 172/78 (BP Location: Left Arm, Patient Position: Sitting, Cuff Size: Large)   Pulse 60   Ht 5\' 10"  (1.778 m)   Wt 249 lb 3.2 oz (113 kg)   SpO2 96%   BMI 35.76 kg/m     Wt Readings from Last 3 Encounters:  11/21/22 249 lb 3.2 oz (113 kg)  01/02/22 241 lb (109.3 kg)  11/14/20 241 lb 12.8 oz (109.7 kg)     General: Alert, oriented x3, no distress, moderately obese, also muscular and fit Head: no evidence of trauma, PERRL, EOMI, no exophtalmos or lid lag, no myxedema, no xanthelasma; normal ears, nose and oropharynx Neck: normal jugular venous pulsations and no hepatojugular reflux; brisk carotid pulses without delay and no carotid bruits Chest: clear to auscultation, no signs of consolidation by percussion or palpation, normal fremitus, symmetrical and full respiratory  excursions Cardiovascular: normal position and quality of the apical impulse, regular rhythm, normal first and second heart sounds, no murmurs, rubs or gallops Abdomen: no tenderness or distention, no masses by palpation, no abnormal pulsatility or arterial bruits, normal bowel sounds, no hepatosplenomegaly Extremities: no clubbing, cyanosis or edema; 2+ radial, ulnar and brachial pulses bilaterally; 2+ right femoral, posterior tibial and dorsalis pedis pulses; 2+ left femoral, posterior tibial and dorsalis pedis pulses; no subclavian or femoral bruits Neurological: grossly nonfocal Psych: Normal mood and affect    ASSESSMENT:    1. Coronary artery disease involving coronary bypass graft of native heart without angina pectoris   2. Essential hypertension   3. Mixed hyperlipidemia   4. Severe obesity (BMI 35.0-39.9) with comorbidity   5. First degree AV block   6. Intraventricular conduction delay      PLAN:    In order of problems listed above:  CAD s/p CABG: Asymptomatic even though he exercises daily.  Most recent functional study was a low risk nuclear stress test in 2018.  The focus is on risk factor modification.  Continue statin and aspirin, no beta blocker due to bradycardia.  HTN: At home his blood pressure is definitely lower than the doctor's office, in the range of 120-150/55-70.  Diastolic blood pressure tends to run a little low.  No changes made to his medications. HLP: LDL is higher than target, for the first time in a while.  Target LDL less than 70.  Discussed pain closer attention to weight loss  via carbohydrate restriction, avoiding saturated fat, increasing intake of unsaturated fat and lean protein.  We will have labs repeated this fall.  If LDL still greater than 70, recommend increasing the dose of atorvastatin to 80 mg daily. Obesity: Strongly recommend weight loss.  His hemoglobin A1c is now in the diabetes mellitus range.  He is to pay closer attention to his  diet. Conduction system disease: Has not had symptoms of bradycardia or pauses.  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  No symptoms of high-grade AV block.  Avoid beta-blockers or other medications with negative chronotropic effect.     Medication Adjustments/Labs and Tests Ordered: Current medicines are reviewed at length with the patient today.  Concerns regarding medicines are outlined above.  Orders Placed This Encounter  Procedures   EKG 12-Lead   No orders of the defined types were placed in this encounter.  Patient Instructions  Medication Instructions:  No changes *If you need a refill on your cardiac medications before your next appointment, please call your pharmacy*  Follow-Up: At [facility], you and your health needs are our priority.  As part of our continuing mission to provide you with exceptional heart care, we have created designated Provider Care Teams.  These Care Teams include your primary Cardiologist (physician) and Advanced Practice Providers (APPs -  Physician Assistants and Nurse Practitioners) who all work together to provide you with the care you need, when you need it.  We recommend signing up for the patient portal called "MyChart".  Sign up information is provided on this After Visit Summary.  MyChart is used to connect with patients for Virtual Visits (Telemedicine).  Patients are able to view lab/test results, encounter notes, upcoming appointments, etc.  Non-urgent messages can be sent to your provider as well.   To learn more about what you can do with MyChart, go to [email or web address].    Your next appointment:   1 year(s)  Provider:   Thurmon Fair, MD      Signed, Thurmon Fair, MD  11/21/2022 11:54 AM    [facility]

## 2022-11-24 ENCOUNTER — Other Ambulatory Visit: Payer: Self-pay | Admitting: Cardiovascular Disease

## 2022-12-03 DIAGNOSIS — E1169 Type 2 diabetes mellitus with other specified complication: Secondary | ICD-10-CM | POA: Diagnosis not present

## 2022-12-03 DIAGNOSIS — I119 Hypertensive heart disease without heart failure: Secondary | ICD-10-CM | POA: Diagnosis not present

## 2022-12-03 DIAGNOSIS — E7800 Pure hypercholesterolemia, unspecified: Secondary | ICD-10-CM | POA: Diagnosis not present

## 2022-12-03 DIAGNOSIS — E119 Type 2 diabetes mellitus without complications: Secondary | ICD-10-CM | POA: Diagnosis not present

## 2022-12-03 DIAGNOSIS — G2581 Restless legs syndrome: Secondary | ICD-10-CM | POA: Diagnosis not present

## 2022-12-03 DIAGNOSIS — I2510 Atherosclerotic heart disease of native coronary artery without angina pectoris: Secondary | ICD-10-CM | POA: Diagnosis not present

## 2023-02-23 DIAGNOSIS — G2581 Restless legs syndrome: Secondary | ICD-10-CM | POA: Diagnosis not present

## 2023-04-11 DIAGNOSIS — R059 Cough, unspecified: Secondary | ICD-10-CM | POA: Diagnosis not present

## 2023-04-11 DIAGNOSIS — R0981 Nasal congestion: Secondary | ICD-10-CM | POA: Diagnosis not present

## 2023-06-05 DIAGNOSIS — Z0000 Encounter for general adult medical examination without abnormal findings: Secondary | ICD-10-CM | POA: Diagnosis not present

## 2023-06-05 DIAGNOSIS — E1169 Type 2 diabetes mellitus with other specified complication: Secondary | ICD-10-CM | POA: Diagnosis not present

## 2023-06-05 DIAGNOSIS — E7800 Pure hypercholesterolemia, unspecified: Secondary | ICD-10-CM | POA: Diagnosis not present

## 2023-06-05 DIAGNOSIS — Z125 Encounter for screening for malignant neoplasm of prostate: Secondary | ICD-10-CM | POA: Diagnosis not present

## 2023-06-05 DIAGNOSIS — I2510 Atherosclerotic heart disease of native coronary artery without angina pectoris: Secondary | ICD-10-CM | POA: Diagnosis not present

## 2023-06-05 DIAGNOSIS — E119 Type 2 diabetes mellitus without complications: Secondary | ICD-10-CM | POA: Diagnosis not present

## 2023-06-05 DIAGNOSIS — I119 Hypertensive heart disease without heart failure: Secondary | ICD-10-CM | POA: Diagnosis not present

## 2023-06-29 ENCOUNTER — Other Ambulatory Visit: Payer: Self-pay | Admitting: Cardiovascular Disease

## 2023-07-14 ENCOUNTER — Observation Stay (HOSPITAL_COMMUNITY)
Admission: EM | Admit: 2023-07-14 | Discharge: 2023-07-16 | Disposition: A | Discharge status: Home / Self Care | Payer: Medicare Other | Attending: Internal Medicine | Admitting: Internal Medicine

## 2023-07-14 ENCOUNTER — Emergency Department (HOSPITAL_COMMUNITY): Payer: Medicare Other

## 2023-07-14 ENCOUNTER — Other Ambulatory Visit: Payer: Self-pay

## 2023-07-14 ENCOUNTER — Encounter (HOSPITAL_COMMUNITY): Payer: Self-pay | Admitting: Family Medicine

## 2023-07-14 DIAGNOSIS — R6510 Systemic inflammatory response syndrome (SIRS) of non-infectious origin without acute organ dysfunction: Secondary | ICD-10-CM | POA: Diagnosis not present

## 2023-07-14 DIAGNOSIS — R509 Fever, unspecified: Secondary | ICD-10-CM | POA: Diagnosis not present

## 2023-07-14 DIAGNOSIS — R6883 Chills (without fever): Principal | ICD-10-CM | POA: Insufficient documentation

## 2023-07-14 DIAGNOSIS — R918 Other nonspecific abnormal finding of lung field: Secondary | ICD-10-CM | POA: Diagnosis not present

## 2023-07-14 DIAGNOSIS — E876 Hypokalemia: Secondary | ICD-10-CM | POA: Insufficient documentation

## 2023-07-14 DIAGNOSIS — L509 Urticaria, unspecified: Secondary | ICD-10-CM | POA: Insufficient documentation

## 2023-07-14 DIAGNOSIS — I10 Essential (primary) hypertension: Secondary | ICD-10-CM | POA: Diagnosis not present

## 2023-07-14 DIAGNOSIS — Z951 Presence of aortocoronary bypass graft: Secondary | ICD-10-CM

## 2023-07-14 DIAGNOSIS — A419 Sepsis, unspecified organism: Principal | ICD-10-CM | POA: Insufficient documentation

## 2023-07-14 DIAGNOSIS — R059 Cough, unspecified: Secondary | ICD-10-CM | POA: Diagnosis not present

## 2023-07-14 DIAGNOSIS — N179 Acute kidney failure, unspecified: Secondary | ICD-10-CM | POA: Diagnosis not present

## 2023-07-14 DIAGNOSIS — R0682 Tachypnea, not elsewhere classified: Secondary | ICD-10-CM

## 2023-07-14 DIAGNOSIS — Z7982 Long term (current) use of aspirin: Secondary | ICD-10-CM | POA: Insufficient documentation

## 2023-07-14 DIAGNOSIS — Z79899 Other long term (current) drug therapy: Secondary | ICD-10-CM | POA: Insufficient documentation

## 2023-07-14 DIAGNOSIS — E785 Hyperlipidemia, unspecified: Secondary | ICD-10-CM | POA: Diagnosis not present

## 2023-07-14 DIAGNOSIS — I700 Atherosclerosis of aorta: Secondary | ICD-10-CM | POA: Diagnosis not present

## 2023-07-14 DIAGNOSIS — K8020 Calculus of gallbladder without cholecystitis without obstruction: Secondary | ICD-10-CM | POA: Diagnosis not present

## 2023-07-14 DIAGNOSIS — K5730 Diverticulosis of large intestine without perforation or abscess without bleeding: Secondary | ICD-10-CM | POA: Diagnosis not present

## 2023-07-14 DIAGNOSIS — I2510 Atherosclerotic heart disease of native coronary artery without angina pectoris: Secondary | ICD-10-CM | POA: Diagnosis not present

## 2023-07-14 DIAGNOSIS — R799 Abnormal finding of blood chemistry, unspecified: Secondary | ICD-10-CM | POA: Diagnosis not present

## 2023-07-14 DIAGNOSIS — D72829 Elevated white blood cell count, unspecified: Secondary | ICD-10-CM | POA: Diagnosis not present

## 2023-07-14 LAB — CBG MONITORING, ED
Glucose-Capillary: 236 mg/dL — ABNORMAL HIGH (ref 70–99)
Glucose-Capillary: 219 mg/dL — ABNORMAL HIGH (ref 70–99)
Glucose-Capillary: 168 mg/dL — ABNORMAL HIGH (ref 70–99)

## 2023-07-14 LAB — RESP PANEL BY RT-PCR (RSV, FLU A&B, COVID)  RVPGX2
Influenza A by PCR: NEGATIVE
Influenza B by PCR: NEGATIVE
Resp Syncytial Virus by PCR: NEGATIVE
SARS Coronavirus 2 by RT PCR: NEGATIVE

## 2023-07-14 LAB — HEPATIC FUNCTION PANEL
ALT: 27 U/L (ref 0–44)
AST: 37 U/L (ref 15–41)
Albumin: 3.8 g/dL (ref 3.5–5.0)
Alkaline Phosphatase: 59 U/L (ref 38–126)
Bilirubin, Direct: 0.3 mg/dL — ABNORMAL HIGH (ref 0.0–0.2)
Indirect Bilirubin: 1.2 mg/dL — ABNORMAL HIGH (ref 0.3–0.9)
Total Bilirubin: 1.5 mg/dL — ABNORMAL HIGH (ref <1.2)
Total Protein: 7.9 g/dL (ref 6.5–8.1)

## 2023-07-14 LAB — URINALYSIS, ROUTINE W REFLEX MICROSCOPIC
Bilirubin Urine: NEGATIVE
Glucose, UA: NEGATIVE mg/dL
Ketones, ur: NEGATIVE mg/dL
Nitrite: NEGATIVE
Protein, ur: NEGATIVE mg/dL
Specific Gravity, Urine: 1.013 (ref 1.005–1.030)
pH: 5 (ref 5.0–8.0)

## 2023-07-14 LAB — BASIC METABOLIC PANEL
Anion gap: 13 (ref 5–15)
BUN: 27 mg/dL — ABNORMAL HIGH (ref 8–23)
CO2: 21 mmol/L — ABNORMAL LOW (ref 22–32)
Calcium: 9 mg/dL (ref 8.9–10.3)
Chloride: 98 mmol/L (ref 98–111)
Creatinine, Ser: 1.8 mg/dL — ABNORMAL HIGH (ref 0.61–1.24)
GFR, Estimated: 39 mL/min — ABNORMAL LOW (ref >60)
Glucose, Bld: 218 mg/dL — ABNORMAL HIGH (ref 70–99)
Potassium: 3.3 mmol/L — ABNORMAL LOW (ref 3.5–5.1)
Sodium: 132 mmol/L — ABNORMAL LOW (ref 135–145)

## 2023-07-14 LAB — CBC
HCT: 43.2 % (ref 39.0–52.0)
Hemoglobin: 14.7 g/dL (ref 13.0–17.0)
MCH: 28.5 pg (ref 26.0–34.0)
MCHC: 34 g/dL (ref 30.0–36.0)
MCV: 83.9 fL (ref 80.0–100.0)
Platelets: 146 10*3/uL — ABNORMAL LOW (ref 150–400)
RBC: 5.15 MIL/uL (ref 4.22–5.81)
RDW: 13.1 % (ref 11.5–15.5)
WBC: 16.3 10*3/uL — ABNORMAL HIGH (ref 4.0–10.5)
nRBC: 0 % (ref 0.0–0.2)

## 2023-07-14 LAB — MAGNESIUM: Magnesium: 2.1 mg/dL (ref 1.7–2.4)

## 2023-07-14 LAB — I-STAT CG4 LACTIC ACID, ED
Lactic Acid, Venous: 2.6 mmol/L (ref 0.5–1.9)
Lactic Acid, Venous: 2 mmol/L (ref 0.5–1.9)

## 2023-07-14 MED ORDER — SODIUM CHLORIDE 0.9% FLUSH
10.0000 mL | Freq: Two times a day (BID) | INTRAVENOUS | Status: DC
  Administered 2023-07-14 – 2023-07-16 (×4): 10 mL via INTRAVENOUS

## 2023-07-14 MED ORDER — OXYCODONE HCL 5 MG PO TABS: 5.0000 mg | ORAL_TABLET | ORAL | Status: DC | PRN

## 2023-07-14 MED ORDER — METRONIDAZOLE 500 MG/100ML IV SOLN
500.0000 mg | Freq: Once | INTRAVENOUS | Status: AC
  Administered 2023-07-14: 500 mg via INTRAVENOUS
  Filled 2023-07-14: qty 100

## 2023-07-14 MED ORDER — LACTATED RINGERS IV BOLUS
2500.0000 mL | Freq: Once | INTRAVENOUS | Status: AC
Start: 2023-07-14 — End: 2023-07-14
  Administered 2023-07-14: 2500 mL via INTRAVENOUS

## 2023-07-14 MED ORDER — LACTATED RINGERS IV BOLUS
1000.0000 mL | Freq: Once | INTRAVENOUS | Status: AC
  Administered 2023-07-14: 1000 mL via INTRAVENOUS

## 2023-07-14 MED ORDER — METRONIDAZOLE 500 MG/100ML IV SOLN
500.0000 mg | Freq: Two times a day (BID) | INTRAVENOUS | Status: DC
  Administered 2023-07-15: 500 mg via INTRAVENOUS
  Filled 2023-07-14: qty 100

## 2023-07-14 MED ORDER — ONDANSETRON HCL 4 MG PO TABS: 4.0000 mg | ORAL_TABLET | Freq: Four times a day (QID) | ORAL | Status: DC | PRN

## 2023-07-14 MED ORDER — POLYETHYLENE GLYCOL 3350 17 G PO PACK: 17.0000 g | PACK | Freq: Every day | ORAL | Status: DC | PRN

## 2023-07-14 MED ORDER — VANCOMYCIN HCL IN DEXTROSE 1-5 GM/200ML-% IV SOLN
1000.0000 mg | Freq: Once | INTRAVENOUS | Status: DC
Start: 2023-07-14 — End: 2023-07-14

## 2023-07-14 MED ORDER — ONDANSETRON HCL 4 MG/2ML IJ SOLN
4.0000 mg | Freq: Four times a day (QID) | INTRAMUSCULAR | Status: DC | PRN
  Administered 2023-07-15: 4 mg via INTRAVENOUS
  Filled 2023-07-14: qty 2

## 2023-07-14 MED ORDER — SODIUM CHLORIDE 0.9% FLUSH
3.0000 mL | Freq: Two times a day (BID) | INTRAVENOUS | Status: DC
  Administered 2023-07-15 – 2023-07-16 (×2): 3 mL via INTRAVENOUS

## 2023-07-14 MED ORDER — SODIUM CHLORIDE 0.9 % IV SOLN
2.0000 g | Freq: Once | INTRAVENOUS | Status: AC
  Administered 2023-07-14: 2 g via INTRAVENOUS
  Filled 2023-07-14: qty 12.5

## 2023-07-14 MED ORDER — POTASSIUM CHLORIDE CRYS ER 20 MEQ PO TBCR
40.0000 meq | EXTENDED_RELEASE_TABLET | Freq: Once | ORAL | Status: AC
  Administered 2023-07-14: 40 meq via ORAL
  Filled 2023-07-14: qty 2

## 2023-07-14 MED ORDER — ACETAMINOPHEN 325 MG PO TABS: 650.0000 mg | ORAL_TABLET | Freq: Four times a day (QID) | ORAL | Status: DC | PRN

## 2023-07-14 MED ORDER — ATORVASTATIN CALCIUM 40 MG PO TABS
40.0000 mg | ORAL_TABLET | Freq: Every day | ORAL | Status: DC
  Administered 2023-07-15: 40 mg via ORAL
  Filled 2023-07-14 (×2): qty 1

## 2023-07-14 MED ORDER — ACETAMINOPHEN 650 MG RE SUPP: 650.0000 mg | Freq: Four times a day (QID) | RECTAL | Status: DC | PRN

## 2023-07-14 MED ORDER — INSULIN ASPART 100 UNIT/ML IJ SOLN: 0.0000 [IU] | Freq: Every day | INTRAMUSCULAR | Status: DC

## 2023-07-14 MED ORDER — SODIUM CHLORIDE 0.9 % IV SOLN
2.0000 g | Freq: Two times a day (BID) | INTRAVENOUS | Status: DC
  Administered 2023-07-15: 2 g via INTRAVENOUS
  Filled 2023-07-14 (×2): qty 12.5

## 2023-07-14 MED ORDER — VANCOMYCIN HCL 10 G IV SOLR
2500.0000 mg | Freq: Once | INTRAVENOUS | Status: AC
  Administered 2023-07-14: 2500 mg via INTRAVENOUS
  Filled 2023-07-14: qty 2500

## 2023-07-14 MED ORDER — HEPARIN SODIUM (PORCINE) 5000 UNIT/ML IJ SOLN
5000.0000 [IU] | Freq: Three times a day (TID) | INTRAMUSCULAR | Status: DC
  Administered 2023-07-14 – 2023-07-15 (×2): 5000 [IU] via SUBCUTANEOUS
  Filled 2023-07-14 (×2): qty 1

## 2023-07-14 MED ORDER — VANCOMYCIN HCL IN DEXTROSE 1-5 GM/200ML-% IV SOLN: 1000.0000 mg | INTRAVENOUS | Status: DC

## 2023-07-14 MED ORDER — ASPIRIN 81 MG PO TBEC
81.0000 mg | DELAYED_RELEASE_TABLET | Freq: Every day | ORAL | Status: DC
  Administered 2023-07-15 – 2023-07-16 (×2): 81 mg via ORAL
  Filled 2023-07-14 (×2): qty 1

## 2023-07-14 MED ORDER — INSULIN ASPART 100 UNIT/ML IJ SOLN
0.0000 [IU] | Freq: Three times a day (TID) | INTRAMUSCULAR | Status: DC
  Administered 2023-07-15: 1 [IU] via SUBCUTANEOUS
  Administered 2023-07-15: 2 [IU] via SUBCUTANEOUS
  Administered 2023-07-16: 1 [IU] via SUBCUTANEOUS

## 2023-07-14 NOTE — ED Provider Notes (Signed)
Patient handed off to me awaiting infectious workup.  Patient febrile with white count in urgent care was sent here for evaluation.  Has had fever 101 for the last 2 days.  Generalized bodyaches cough.  There was mention of a rash but he does not really have a very concerning rash on exam.  May be some viral type exanthem looking process if anything.  Ultimately patient got hypotensive in the 60s symptomatic.  Responded well to 30 cc/kg IV fluids which were ordered as well as broad-spectrum IV antibiotics.  He does have hardware in his low back which could be may be a source for bacteremia but I do not know if he has other reasons to be occultly bacteremic but there was no source for infection found thus far with unremarkable urinalysis.  COVID and flu test negative.  CT of the chest abdomen and pelvis with no acute findings.  His lactic acid was 2.6 and has improved to 2.  He is feeling much better.  Not having any headache or neck pain.  No concern for meningitis.  White count 16.  Hemodynamics have improved.  Overall I will admit him for further care and make sure blood cultures are negative.  I have no concern for other emergent process at this time.  He does have an AKI with a creatinine of 1.8.  This chart was dictated using voice recognition software.  Despite best efforts to proofread,  errors can occur which can change the documentation meaning.   .Critical Care  Performed by: Virgina Norfolk, DO Authorized by: Virgina Norfolk, DO   Critical care provider statement:    Critical care time (minutes):  35   Critical care was necessary to treat or prevent imminent or life-threatening deterioration of the following conditions:  Sepsis   Critical care was time spent personally by me on the following activities:  Development of treatment plan with patient or surrogate, blood draw for specimens, discussions with primary provider, evaluation of patient's response to treatment, examination of patient,  obtaining history from patient or surrogate, ordering and performing treatments and interventions, ordering and review of laboratory studies, ordering and review of radiographic studies, pulse oximetry, re-evaluation of patient's condition and review of old charts   Care discussed with: admitting provider       Virgina Norfolk, DO 07/14/23 2117

## 2023-07-14 NOTE — Progress Notes (Signed)
Elink is following this code sepsis ?

## 2023-07-14 NOTE — ED Triage Notes (Addendum)
Pt to ED via pov from [facility]. Pt went there today because he started feel bad, shaky, and having hot/cold feeling. Pt states he was sent to ED d/t elevated WBC (16.0).  Pt has red, blanchable rash around trunk.

## 2023-07-14 NOTE — Progress Notes (Signed)
ED Pharmacy Antibiotic Sign Off An antibiotic consult was received from an ED provider for vanc/cefepime per pharmacy dosing for sepsis. A chart review was completed to assess appropriateness.   The following one time order(s) were placed:  Vancomycin 2.5g x1 Cefepime 2g x1  Further antibiotic and/or antibiotic pharmacy consults should be ordered by the admitting provider if indicated.   Thank you for allowing pharmacy to be a part of this patient's care.   Rutherford Nail, PharmD PGY2 Critical Care Pharmacy Resident 07/14/23 6:16 PM

## 2023-07-14 NOTE — H&P (Signed)
History and Physical    JOSEMANUEL HENNINGSEN ZOX:[id number] DOB: [birth year] DOA: 07/14/2023  PCP: Ollen Bowl, MD   Patient coming from: Home   Chief Complaint: Chills, malaise   HPI: PEDRITO SHELLHORN is a 72 y.o. male with medical history significant for hypertension, hyperlipidemia, diet-controlled diabetes mellitus, and CAD with CABG in 2011 and presents with chills and general malaise.   Patient reports 3 days of chills, aches, nausea without vomiting, and fatigue.  He has a mild cough on occasion but denies sputum production, shortness of breath, abdominal pain, vomiting, diarrhea, dysuria, flank pain, headache, neck stiffness, or wounds.  He had a faint truncal rash noted at urgent care today which has since resolved; patient was not aware of this and denies any pruritus.  ED Course: Upon arrival to the ED, patient is found to be afebrile and saturating well on room air with transient tachypnea and transient hypotension.  Labs are most notable for glucose 218, potassium 3.3, creatinine 1.80, WBC 16,300, and lactic acid 2.6.  Blood cultures were collected in the ED and the patient was treated with 3.5 L of LR, potassium, vancomycin, cefepime, and Flagyl.  Review of Systems:  All other systems reviewed and apart from HPI, are negative.  Past Medical History:  Diagnosis Date   CAD (coronary artery disease)    Chest wall pain following surgery    Diabetes mellitus without complication (HCC)    pt states that he is not a diabetic; and does not take any diabetes medication   Dyspnea    went to ED for SOB in january   Hyperlipidemia    Hypertension    Myocardial infarction [facility])    2011   Pleural effusion, left     Past Surgical History:  Procedure Laterality Date   CARDIAC CATHETERIZATION  04/29/10   LEFT MAIN-FAIRLY SHORT VESSEL, FREE OF DISEASE. LAD 70-90%. A CERTAIN SEGMENT APPEARS SLIGHTLY ULCERATED, THERE MAYBE ACTIVE RUPTURED PLAQUE. RAMUS INTERMEDIUS 95% IN THE PROXIMAL  THIRD. LEFT CX 70-80% STENOSIS IN THE MID OM. RCA 40-50% OSTIAL STENOSIS. REFERRED FOR CABG.   CAROTID DUPLEX Bilateral 04/29/10   NO ICA STENOSIS BIL.+   CORONARY ARTERY BYPASS GRAFT  04/30/10   X4   Coronary artery bypass grafting  05/01/2010   lumbar laminectomy in 2006 by Dr. Charlesetta Ivory LAMINECTOMY/DECOMPRESSION MICRODISCECTOMY Left 12/17/2016   Procedure: Microlumbar decompression L2-L3;  Surgeon: Jene Every, MD;  Location: [facility];  Service: Orthopedics;  Laterality: Left;  Requests for 2 hrs   NUCLEAR STRESS TEST N/A 03/01/10   NORMAL PERFUSION PATTERN IN ALL REGIONS. EF 69%.NL MYOCARDIAL PERFUSION STUDY.   TRANSTHORACIC ECHOCARDIOGRAM N/A 04/27/10   LV SIZE IS NORMAL. SYSTOLIC FUNC IS NORMAL.    Social History:   reports that he has never smoked. He has never used smokeless tobacco. He reports current alcohol use. He reports that he does not use drugs.  No Known Allergies  Family History  Family history unknown: Yes     Prior to Admission medications   Medication Sig Start Date End Date Taking? Authorizing Provider  aspirin EC 81 MG tablet Take 81 mg by mouth daily.   Yes [provider]  atorvastatin (LIPITOR) 40 MG tablet TAKE 1 TABLET BY MOUTH DAILY 07/01/23  Yes Croitoru, Mihai, MD  hydrochlorothiazide (MICROZIDE) 12.5 MG capsule TAKE 1 CAPSULE BY MOUTH DAILY 11/25/22  Yes Croitoru, Mihai, MD  losartan (COZAAR) 100 MG tablet Take 1 tablet (100 mg total) by  mouth daily. 10/21/18  Yes Croitoru, Mihai, MD  Polyethylene Glycol 3350 (MIRALAX PO) Take 17 g by mouth daily as needed (for constipation).   Yes [provider]    Physical Exam: Vitals:   07/14/23 1900 07/14/23 2100 07/14/23 2235 07/14/23 2257  BP: 135/71 139/62  (!) 146/93  Pulse: 64 75  82  Resp: 17 20  (!) 21  Temp:   98.1 F (36.7 C) 98.7 F (37.1 C)  TempSrc:   Oral Oral  SpO2: 100% 100%  97%  Weight:    114.3 kg  Height:    5\' 9"  (1.753 m)    Constitutional: NAD, no pallor or  diaphoresis  Eyes: PERTLA, lids and conjunctivae normal ENMT: Mucous membranes are moist. Posterior pharynx clear of any exudate or lesions.   Neck: supple, no masses  Respiratory: no wheezing, no crackles. No accessory muscle use.  Cardiovascular: S1 & S2 heard, regular rate and rhythm. No extremity edema.  Abdomen: No distension, no tenderness, soft. Bowel sounds active.  Musculoskeletal: no clubbing / cyanosis. No joint deformity upper and lower extremities. No meningismus.  Skin: no significant rashes, lesions, ulcers. Warm, dry, well-perfused. Neurologic: CN 2-12 grossly intact. Moving all extremities. Alert and oriented.  Psychiatric: Calm. Cooperative.    Labs and Imaging on Admission: I have personally reviewed following labs and imaging studies  CBC: Recent Labs  Lab 07/14/23 1415  WBC 16.3*  HGB 14.7  HCT 43.2  MCV 83.9  PLT 146*   Basic Metabolic Panel: Recent Labs  Lab 07/14/23 1415 07/14/23 1627  NA 132*  --   K 3.3*  --   CL 98  --   CO2 21*  --   GLUCOSE 218*  --   BUN 27*  --   CREATININE 1.80*  --   CALCIUM 9.0  --   MG  --  2.1   GFR: Estimated Creatinine Clearance: 46.2 mL/min (A) (by C-G formula based on SCr of 1.8 mg/dL (H)). Liver Function Tests: Recent Labs  Lab 07/14/23 1627  AST 37  ALT 27  ALKPHOS 59  BILITOT 1.5*  PROT 7.9  ALBUMIN 3.8   No results for input(s): "LIPASE", "AMYLASE" in the last 168 hours. No results for input(s): "AMMONIA" in the last 168 hours. Coagulation Profile: No results for input(s): "INR", "PROTIME" in the last 168 hours. Cardiac Enzymes: No results for input(s): "CKTOTAL", "CKMB", "CKMBINDEX", "TROPONINI" in the last 168 hours. BNP (last 3 results) No results for input(s): "PROBNP" in the last 8760 hours. HbA1C: No results for input(s): "HGBA1C" in the last 72 hours. CBG: Recent Labs  Lab 07/14/23 1415 07/14/23 1553 07/14/23 2222  GLUCAP 236* 219* 168*   Lipid Profile: No results for input(s):  "CHOL", "HDL", "LDLCALC", "TRIG", "CHOLHDL", "LDLDIRECT" in the last 72 hours. Thyroid Function Tests: No results for input(s): "TSH", "T4TOTAL", "FREET4", "T3FREE", "THYROIDAB" in the last 72 hours. Anemia Panel: No results for input(s): "VITAMINB12", "FOLATE", "FERRITIN", "TIBC", "IRON", "RETICCTPCT" in the last 72 hours. Urine analysis:    Component Value Date/Time   COLORURINE YELLOW 07/14/2023 1953   APPEARANCEUR HAZY (A) 07/14/2023 1953   LABSPEC 1.013 07/14/2023 1953   PHURINE 5.0 07/14/2023 1953   GLUCOSEU NEGATIVE 07/14/2023 1953   HGBUR SMALL (A) 07/14/2023 1953   BILIRUBINUR NEGATIVE 07/14/2023 1953   KETONESUR NEGATIVE 07/14/2023 1953   PROTEINUR NEGATIVE 07/14/2023 1953   NITRITE NEGATIVE 07/14/2023 1953   LEUKOCYTESUR TRACE (A) 07/14/2023 1953   Sepsis Labs: @LABRCNTIP (procalcitonin:4,lacticidven:4) ) Recent Results (from  the past 240 hour(s))  Resp panel by RT-PCR (RSV, Flu A&B, Covid) Anterior Nasal Swab     Status: None   Collection Time: 07/14/23  8:02 PM   Specimen: Anterior Nasal Swab  Result Value Ref Range Status   SARS Coronavirus 2 by RT PCR NEGATIVE NEGATIVE Final   Influenza A by PCR NEGATIVE NEGATIVE Final   Influenza B by PCR NEGATIVE NEGATIVE Final    Comment: (NOTE) The Xpert Xpress SARS-CoV-2/FLU/RSV plus assay is intended as an aid in the diagnosis of influenza from Nasopharyngeal swab specimens and should not be used as a sole basis for treatment. Nasal washings and aspirates are unacceptable for Xpert Xpress SARS-CoV-2/FLU/RSV testing.  Fact Sheet for Patients: BloggerCourse.com  Fact Sheet for Healthcare Providers: SeriousBroker.it  This test is not yet approved or cleared by the [place] FDA and has been authorized for detection and/or diagnosis of SARS-CoV-2 by FDA under an Emergency Use Authorization (EUA). This EUA will remain in effect (meaning this test can be used) for the  duration of the COVID-19 declaration under Section 564(b)(1) of the Act, 21 U.S.C. section 360bbb-3(b)(1), unless the authorization is terminated or revoked.     Resp Syncytial Virus by PCR NEGATIVE NEGATIVE Final    Comment: (NOTE) Fact Sheet for Patients: BloggerCourse.com  Fact Sheet for Healthcare Providers: SeriousBroker.it  This test is not yet approved or cleared by the [place] FDA and has been authorized for detection and/or diagnosis of SARS-CoV-2 by FDA under an Emergency Use Authorization (EUA). This EUA will remain in effect (meaning this test can be used) for the duration of the COVID-19 declaration under Section 564(b)(1) of the Act, 21 U.S.C. section 360bbb-3(b)(1), unless the authorization is terminated or revoked.  Performed at [facility], [address] [place], [place] [address]      Radiological Exams on Admission: CT CHEST ABDOMEN PELVIS WO CONTRAST  Result Date: 07/14/2023 CLINICAL DATA:  Sepsis. EXAM: CT CHEST, ABDOMEN AND PELVIS WITHOUT CONTRAST TECHNIQUE: Multidetector CT imaging of the chest, abdomen and pelvis was performed following the standard protocol without IV contrast. RADIATION DOSE REDUCTION: This exam was performed according to the departmental dose-optimization program which includes automated exposure control, adjustment of the mA and/or kV according to patient size and/or use of iterative reconstruction technique. COMPARISON:  CT abdomen pelvis dated 08/28/2016. FINDINGS: Evaluation of this exam is limited in the absence of intravenous contrast. CT CHEST FINDINGS Cardiovascular: There is no cardiomegaly or pericardial effusion. Three-vessel coronary vascular calcification. Mild atherosclerotic calcification of the thoracic aorta. No aneurysmal dilatation. The central pulmonary arteries are grossly unremarkable. Mediastinum/Nodes: No hilar or mediastinal adenopathy. The esophagus is  grossly unremarkable. No mediastinal fluid collection. Lungs/Pleura: Postsurgical changes of the right lung base. Bibasilar linear atelectasis/scarring. No consolidative changes. There is no pleural effusion pneumothorax. The central airways are patent. Musculoskeletal: Median sternotomy wires. No acute osseous pathology. CT ABDOMEN PELVIS FINDINGS No intra-abdominal free air or free fluid. Hepatobiliary: Mild fatty liver. No biliary dilatation. Gallstones. No pericholecystic fluid or evidence of acute cholecystitis by CT. Pancreas: Unremarkable. No pancreatic ductal dilatation or surrounding inflammatory changes. Spleen: Normal in size without focal abnormality. Adrenals/Urinary Tract: The adrenal glands unremarkable. There is no hydronephrosis or nephrolithiasis on either side. The visualized ureters and urinary bladder appear unremarkable. Stomach/Bowel: There is sigmoid diverticulosis without active inflammatory changes. There is no bowel obstruction or active inflammation. The appendix is not visualized with certainty. No inflammatory changes identified in the right lower quadrant. Vascular/Lymphatic: Mild  aortoiliac atherosclerotic disease. The IVC is unremarkable. No portal venous gas. There is no adenopathy. Reproductive: The prostate and seminal vesicles are grossly unremarkable. No pelvic mass. Other: None Musculoskeletal: Degenerative changes of the spine. No acute osseous pathology. IMPRESSION: 1. No acute intrathoracic, abdominal, or pelvic pathology. 2. Sigmoid diverticulosis. No bowel obstruction. 3. Cholelithiasis. 4. Mild fatty liver. 5.  Aortic Atherosclerosis (ICD10-I70.0). Electronically Signed   By: Elgie Collard M.D.   On: 07/14/2023 21:08   DG Chest Portable 1 View  Result Date: 07/14/2023 CLINICAL DATA:  Chills with shakiness and leukocytosis. EXAM: PORTABLE CHEST 1 VIEW COMPARISON:  CT 04/30/2016.  Radiographs 04/30/2016 and 06/18/2011. FINDINGS: 1540 hours. The heart size and  mediastinal contours are stable status post median sternotomy and CABG. Probable mild atelectasis at both lung bases, left greater than right. No confluent airspace disease, pleural effusion or pneumothorax. The bones appear unchanged. IMPRESSION: Probable mild bibasilar atelectasis. No definite evidence of pneumonia. Electronically Signed   By: Carey Bullocks M.D.   On: 07/14/2023 17:16    EKG: Independently reviewed. Sinus rhythm, LAD, incomplete RBBB.   Assessment/Plan   1. SIRS  - Febrile at Urgent Care today and has leukocytosis and tachypnea in ED  - There is concern for bacterial infection but source not identified  - Blood cultures were collected in ED and broad-spectrum antibiotics started  - Continue empiric antibiotics for now, follow cultures and clinical course   2. AKI  - SCr is 1.80 on admission, up from apparent baseline of 1.1  - No recent labs but suspect this is acute in setting of acute illness  - No hydronephrosis on CT  - He was fluid-resuscitated in ED  - Hold ARB and hydrochlorothiazide, repeat chem panel in am   3. CAD  - Hx of CABG in 2011  - No anginal symptoms  - Continue ASA and Lipitor   4. Hypokalemia  - Repalced in ED    5. Type II DM  - Most recent A1c was 6.5% per chart notes; serum glucose 218 in ED  - Diet-controlled at home  - Check CBGs and use low-intensity SSI    DVT prophylaxis: sq heparin  Code Status: Full  Level of Care: Level of care: Progressive Family Communication: None present  Disposition Plan:  Patient is from: home  Anticipated d/c is to: Home  Anticipated d/c date is: 07/16/23  Patient currently: Pending clinical stability, improved renal function, cultures   Consults called: None  Admission status: Inpatient     Briscoe Deutscher, MD [facility]  07/14/2023, 11:04 PM

## 2023-07-14 NOTE — Progress Notes (Signed)
Pharmacy Antibiotic Note  Adrian [name] is a 72 y.o. male admitted on 07/14/2023 with  fever and elevated white blood count, responsive to 30 mL/kg IV fluid bolus and broad spectrum IV antibiotics .  Pharmacy has been consulted for cefepime/vancomycin dosing while infectious workup is pending. Unclear at this time source of infection.  Plan: Vancomycin 1g q24h Cefepime 2g q12h F/u infectious workup to guide deescalation and duration of therapy Monitor renal function for changes in dosing  Height: 5\' 9"  (175.3 cm) Weight: 115.2 kg (254 lb) IBW/kg (Calculated) : 70.7  Temp (24hrs), Avg:97.9 F (36.6 C), Min:97.6 F (36.4 C), Max:98.1 F (36.7 C)  Recent Labs  Lab 07/14/23 1415 07/14/23 1704 07/14/23 2047  WBC 16.3*  --   --   CREATININE 1.80*  --   --   LATICACIDVEN  --  2.6* 2.0*    Estimated Creatinine Clearance: 46.4 mL/min (A) (by C-G formula based on SCr of 1.8 mg/dL (H)).    No Known Allergies  Antimicrobials this admission: Vanc 11/26 >> Cefepime 11/26 >> Flagyl 11/26 >>  Microbiology results: 11/26 Bcx:  Thank you for allowing pharmacy to be a part of this patient's care.  Rutherford Nail, PharmD PGY2 Critical Care Pharmacy Resident 07/14/2023 9:51 PM

## 2023-07-14 NOTE — ED Notes (Signed)
ED TO INPATIENT HANDOFF REPORT  ED Nurse Name and Phone #: Bernis Stecher, RN 73  S Name/Age/Gender Adrian [name] 72 y.o. male Room/Bed: 011C/011C  Code Status   Code Status: Full Code  Home/SNF/Other Home Patient oriented to: self, place, time, and situation Is this baseline? Yes   Triage Complete: Triage complete  Chief Complaint SIRS (systemic inflammatory response syndrome) (HCC) [R65.10]  Triage Note Pt to ED via pov from [facility]. Pt went there today because he started feel bad, shaky, and having hot/cold feeling. Pt states he was sent to ED d/t elevated WBC (16.0).  Pt has red, blanchable rash around trunk.   Allergies No Known Allergies  Level of Care/Admitting Diagnosis ED Disposition     ED Disposition  Admit   Condition  --   Comment  Hospital Area: [facility] [[id number]]  Level of Care: Progressive [102]  Admit to Progressive based on following criteria: MULTISYSTEM THREATS such as stable sepsis, metabolic/electrolyte imbalance with or without encephalopathy that is responding to early treatment.  May admit patient to [facility] or [facility] if equivalent level of care is available:: Yes  Covid Evaluation: Confirmed COVID Negative  Diagnosis: SIRS (systemic inflammatory response syndrome) [facility]) [[id number]]  Admitting Physician: Briscoe Deutscher [[id number]]  Attending Physician: Briscoe Deutscher [[id number]]  Certification:: I certify this patient will need inpatient services for at least 2 midnights  Expected Medical Readiness: 07/17/2023          B Medical/Surgery History Past Medical History:  Diagnosis Date   CAD (coronary artery disease)    Chest wall pain following surgery    Diabetes mellitus without complication (HCC)    pt states that he is not a diabetic; and does not take any diabetes medication   Dyspnea    went to ED for SOB in january   Hyperlipidemia    Hypertension    Myocardial infarction (HCC)    2011   Pleural  effusion, left    Past Surgical History:  Procedure Laterality Date   CARDIAC CATHETERIZATION  04/29/10   LEFT MAIN-FAIRLY SHORT VESSEL, FREE OF DISEASE. LAD 70-90%. A CERTAIN SEGMENT APPEARS SLIGHTLY ULCERATED, THERE MAYBE ACTIVE RUPTURED PLAQUE. RAMUS INTERMEDIUS 95% IN THE PROXIMAL THIRD. LEFT CX 70-80% STENOSIS IN THE MID OM. RCA 40-50% OSTIAL STENOSIS. REFERRED FOR CABG.   CAROTID DUPLEX Bilateral 04/29/10   NO ICA STENOSIS BIL.+   CORONARY ARTERY BYPASS GRAFT  04/30/10   X4   Coronary artery bypass grafting  05/01/2010   lumbar laminectomy in 2006 by Dr. Charlesetta Ivory LAMINECTOMY/DECOMPRESSION MICRODISCECTOMY Left 12/17/2016   Procedure: Microlumbar decompression L2-L3;  Surgeon: Jene Every, MD;  Location: [facility];  Service: Orthopedics;  Laterality: Left;  Requests for 2 hrs   NUCLEAR STRESS TEST N/A 03/01/10   NORMAL PERFUSION PATTERN IN ALL REGIONS. EF 69%.NL MYOCARDIAL PERFUSION STUDY.   TRANSTHORACIC ECHOCARDIOGRAM N/A 04/27/10   LV SIZE IS NORMAL. SYSTOLIC FUNC IS NORMAL.     A IV Location/Drains/Wounds Patient Lines/Drains/Airways Status     Active Line/Drains/Airways     Name Placement date Placement time Site Days   Peripheral IV 07/14/23 20 G Right Antecubital 07/14/23  1754  Antecubital  less than 1   Incision (Closed) 12/17/16 Back Other (Comment) 12/17/16  1320  -- 2400            Intake/Output Last 24 hours  Intake/Output Summary (Last 24 hours) at 07/14/2023 2153 Last data filed at 07/14/2023 1813  Gross per 24 hour  Intake 1000 ml  Output --  Net 1000 ml    Labs/Imaging Results for orders placed or performed during the hospital encounter of 07/14/23 (from the past 48 hour(s))  Basic metabolic panel     Status: Abnormal   Collection Time: 07/14/23  2:15 PM  Result Value Ref Range   Sodium 132 (L) 135 - 145 mmol/L   Potassium 3.3 (L) 3.5 - 5.1 mmol/L   Chloride 98 98 - 111 mmol/L   CO2 21 (L) 22 - 32 mmol/L   Glucose, Bld 218 (H) 70 - 99 mg/dL     Comment: Glucose reference range applies only to samples taken after fasting for at least 8 hours.   BUN 27 (H) 8 - 23 mg/dL   Creatinine, Ser 8.29 (H) 0.61 - 1.24 mg/dL   Calcium 9.0 8.9 - 56.2 mg/dL   GFR, Estimated 39 (L) >60 mL/min    Comment: (NOTE) Calculated using the CKD-EPI Creatinine Equation (2021)    Anion gap 13 5 - 15    Comment: Performed at [facility], [address], [place], [place] [address]  CBC     Status: Abnormal   Collection Time: 07/14/23  2:15 PM  Result Value Ref Range   WBC 16.3 (H) 4.0 - 10.5 K/uL   RBC 5.15 4.22 - 5.81 MIL/uL   Hemoglobin 14.7 13.0 - 17.0 g/dL   HCT 57.8 46.9 - 62.9 %   MCV 83.9 80.0 - 100.0 fL   MCH 28.5 26.0 - 34.0 pg   MCHC 34.0 30.0 - 36.0 g/dL   RDW 52.8 41.3 - 24.4 %   Platelets 146 (L) 150 - 400 K/uL   nRBC 0.0 0.0 - 0.2 %    Comment: Performed at [facility], [address] [place], [place] [address]  CBG monitoring, ED     Status: Abnormal   Collection Time: 07/14/23  2:15 PM  Result Value Ref Range   Glucose-Capillary 236 (H) 70 - 99 mg/dL    Comment: Glucose reference range applies only to samples taken after fasting for at least 8 hours.  POC CBG, ED     Status: Abnormal   Collection Time: 07/14/23  3:53 PM  Result Value Ref Range   Glucose-Capillary 219 (H) 70 - 99 mg/dL    Comment: Glucose reference range applies only to samples taken after fasting for at least 8 hours.  Hepatic function panel     Status: Abnormal   Collection Time: 07/14/23  4:27 PM  Result Value Ref Range   Total Protein 7.9 6.5 - 8.1 g/dL   Albumin 3.8 3.5 - 5.0 g/dL   AST 37 15 - 41 U/L   ALT 27 0 - 44 U/L   Alkaline Phosphatase 59 38 - 126 U/L   Total Bilirubin 1.5 (H) <1.2 mg/dL   Bilirubin, Direct 0.3 (H) 0.0 - 0.2 mg/dL   Indirect Bilirubin 1.2 (H) 0.3 - 0.9 mg/dL    Comment: Performed at [facility], [address] [place], [place] [address]  Magnesium     Status: None   Collection Time: 07/14/23  4:27 PM   Result Value Ref Range   Magnesium 2.1 1.7 - 2.4 mg/dL    Comment: Performed at [facility], [address], [place], [place] [address]  I-Stat CG4 Lactic Acid     Status: Abnormal   Collection Time: 07/14/23  5:04 PM  Result Value Ref Range  Lactic Acid, Venous 2.6 (HH) 0.5 - 1.9 mmol/L   Comment NOTIFIED PHYSICIAN   Urinalysis, Routine w reflex microscopic -Urine, Clean Catch     Status: Abnormal   Collection Time: 07/14/23  7:53 PM  Result Value Ref Range   Color, Urine YELLOW YELLOW   APPearance HAZY (A) CLEAR   Specific Gravity, Urine 1.013 1.005 - 1.030   pH 5.0 5.0 - 8.0   Glucose, UA NEGATIVE NEGATIVE mg/dL   Hgb urine dipstick SMALL (A) NEGATIVE   Bilirubin Urine NEGATIVE NEGATIVE   Ketones, ur NEGATIVE NEGATIVE mg/dL   Protein, ur NEGATIVE NEGATIVE mg/dL   Nitrite NEGATIVE NEGATIVE   Leukocytes,Ua TRACE (A) NEGATIVE   RBC / HPF 0-5 0 - 5 RBC/hpf   WBC, UA 6-10 0 - 5 WBC/hpf   Bacteria, UA RARE (A) NONE SEEN   Squamous Epithelial / HPF 0-5 0 - 5 /HPF   Mucus PRESENT    Hyaline Casts, UA PRESENT     Comment: Performed at [facility], [address] [place], [place] [address]  Resp panel by RT-PCR (RSV, Flu A&B, Covid) Anterior Nasal Swab     Status: None   Collection Time: 07/14/23  8:02 PM   Specimen: Anterior Nasal Swab  Result Value Ref Range   SARS Coronavirus 2 by RT PCR NEGATIVE NEGATIVE   Influenza A by PCR NEGATIVE NEGATIVE   Influenza B by PCR NEGATIVE NEGATIVE    Comment: (NOTE) The Xpert Xpress SARS-CoV-2/FLU/RSV plus assay is intended as an aid in the diagnosis of influenza from Nasopharyngeal swab specimens and should not be used as a sole basis for treatment. Nasal washings and aspirates are unacceptable for Xpert Xpress SARS-CoV-2/FLU/RSV testing.  Fact Sheet for Patients: BloggerCourse.com  Fact Sheet for Healthcare Providers: SeriousBroker.it  This test is not yet approved or  cleared by the [place] FDA and has been authorized for detection and/or diagnosis of SARS-CoV-2 by FDA under an Emergency Use Authorization (EUA). This EUA will remain in effect (meaning this test can be used) for the duration of the COVID-19 declaration under Section 564(b)(1) of the Act, 21 U.S.C. section 360bbb-3(b)(1), unless the authorization is terminated or revoked.     Resp Syncytial Virus by PCR NEGATIVE NEGATIVE    Comment: (NOTE) Fact Sheet for Patients: BloggerCourse.com  Fact Sheet for Healthcare Providers: SeriousBroker.it  This test is not yet approved or cleared by the [place] FDA and has been authorized for detection and/or diagnosis of SARS-CoV-2 by FDA under an Emergency Use Authorization (EUA). This EUA will remain in effect (meaning this test can be used) for the duration of the COVID-19 declaration under Section 564(b)(1) of the Act, 21 U.S.C. section 360bbb-3(b)(1), unless the authorization is terminated or revoked.  Performed at [facility], [address], [place], [place] [address]   I-Stat CG4 Lactic Acid     Status: Abnormal   Collection Time: 07/14/23  8:47 PM  Result Value Ref Range   Lactic Acid, Venous 2.0 (HH) 0.5 - 1.9 mmol/L   Comment NOTIFIED PHYSICIAN    CT CHEST ABDOMEN PELVIS WO CONTRAST  Result Date: 07/14/2023 CLINICAL DATA:  Sepsis. EXAM: CT CHEST, ABDOMEN AND PELVIS WITHOUT CONTRAST TECHNIQUE: Multidetector CT imaging of the chest, abdomen and pelvis was performed following the standard protocol without IV contrast. RADIATION DOSE REDUCTION: This exam was performed according to the departmental dose-optimization program which includes automated exposure control, adjustment of the mA and/or kV according to patient size and/or  use of iterative reconstruction technique. COMPARISON:  CT abdomen pelvis dated 08/28/2016. FINDINGS: Evaluation of this exam is limited in the  absence of intravenous contrast. CT CHEST FINDINGS Cardiovascular: There is no cardiomegaly or pericardial effusion. Three-vessel coronary vascular calcification. Mild atherosclerotic calcification of the thoracic aorta. No aneurysmal dilatation. The central pulmonary arteries are grossly unremarkable. Mediastinum/Nodes: No hilar or mediastinal adenopathy. The esophagus is grossly unremarkable. No mediastinal fluid collection. Lungs/Pleura: Postsurgical changes of the right lung base. Bibasilar linear atelectasis/scarring. No consolidative changes. There is no pleural effusion pneumothorax. The central airways are patent. Musculoskeletal: Median sternotomy wires. No acute osseous pathology. CT ABDOMEN PELVIS FINDINGS No intra-abdominal free air or free fluid. Hepatobiliary: Mild fatty liver. No biliary dilatation. Gallstones. No pericholecystic fluid or evidence of acute cholecystitis by CT. Pancreas: Unremarkable. No pancreatic ductal dilatation or surrounding inflammatory changes. Spleen: Normal in size without focal abnormality. Adrenals/Urinary Tract: The adrenal glands unremarkable. There is no hydronephrosis or nephrolithiasis on either side. The visualized ureters and urinary bladder appear unremarkable. Stomach/Bowel: There is sigmoid diverticulosis without active inflammatory changes. There is no bowel obstruction or active inflammation. The appendix is not visualized with certainty. No inflammatory changes identified in the right lower quadrant. Vascular/Lymphatic: Mild aortoiliac atherosclerotic disease. The IVC is unremarkable. No portal venous gas. There is no adenopathy. Reproductive: The prostate and seminal vesicles are grossly unremarkable. No pelvic mass. Other: None Musculoskeletal: Degenerative changes of the spine. No acute osseous pathology. IMPRESSION: 1. No acute intrathoracic, abdominal, or pelvic pathology. 2. Sigmoid diverticulosis. No bowel obstruction. 3. Cholelithiasis. 4. Mild fatty  liver. 5.  Aortic Atherosclerosis (ICD10-I70.0). Electronically Signed   By: Elgie Collard M.D.   On: 07/14/2023 21:08   DG Chest Portable 1 View  Result Date: 07/14/2023 CLINICAL DATA:  Chills with shakiness and leukocytosis. EXAM: PORTABLE CHEST 1 VIEW COMPARISON:  CT 04/30/2016.  Radiographs 04/30/2016 and 06/18/2011. FINDINGS: 1540 hours. The heart size and mediastinal contours are stable status post median sternotomy and CABG. Probable mild atelectasis at both lung bases, left greater than right. No confluent airspace disease, pleural effusion or pneumothorax. The bones appear unchanged. IMPRESSION: Probable mild bibasilar atelectasis. No definite evidence of pneumonia. Electronically Signed   By: Carey Bullocks M.D.   On: 07/14/2023 17:16    Pending Labs Unresulted Labs (From admission, onward)     Start     Ordered   07/15/23 0500  Hemoglobin A1c  Tomorrow morning,   R       Comments: To assess prior glycemic control    07/14/23 2145   07/15/23 0500  Basic metabolic panel  Daily,   R      07/14/23 2145   07/15/23 0500  Hepatic function panel  Tomorrow morning,   R        07/14/23 2145   07/15/23 0500  CBC  Daily,   R      07/14/23 2145   07/14/23 1532  Blood culture (routine x 2)  BLOOD CULTURE X 2,   R (with STAT occurrences)      07/14/23 1532            Vitals/Pain Today's Vitals   07/14/23 1830 07/14/23 1845 07/14/23 1900 07/14/23 2100  BP: 138/66 123/82 135/71 139/62  Pulse: 63 66 64 75  Resp: (!) 22 (!) 22 17 20   Temp:      TempSrc:      SpO2: 100% 100% 100% 100%  Weight:      Height:  PainSc:        Isolation Precautions No active isolations  Medications Medications  sodium chloride flush (NS) 0.9 % injection 10 mL (has no administration in time range)  ceFEPIme (MAXIPIME) 2 g in sodium chloride 0.9 % 100 mL IVPB (has no administration in time range)  vancomycin (VANCOCIN) 2,500 mg in sodium chloride 0.9 % 500 mL IVPB (2,500 mg Intravenous New  Bag/Given 07/14/23 2034)  aspirin EC tablet 81 mg (has no administration in time range)  atorvastatin (LIPITOR) tablet 40 mg (has no administration in time range)  insulin aspart (novoLOG) injection 0-6 Units (has no administration in time range)  insulin aspart (novoLOG) injection 0-5 Units (has no administration in time range)  heparin injection 5,000 Units (has no administration in time range)  sodium chloride flush (NS) 0.9 % injection 3 mL (has no administration in time range)  acetaminophen (TYLENOL) tablet 650 mg (has no administration in time range)    Or  acetaminophen (TYLENOL) suppository 650 mg (has no administration in time range)  oxyCODONE (Oxy IR/ROXICODONE) immediate release tablet 5 mg (has no administration in time range)  polyethylene glycol (MIRALAX / GLYCOLAX) packet 17 g (has no administration in time range)  ondansetron (ZOFRAN) tablet 4 mg (has no administration in time range)    Or  ondansetron (ZOFRAN) injection 4 mg (has no administration in time range)  metroNIDAZOLE (FLAGYL) IVPB 500 mg (has no administration in time range)  ceFEPIme (MAXIPIME) 2 g in sodium chloride 0.9 % 100 mL IVPB (has no administration in time range)  vancomycin (VANCOCIN) IVPB 1000 mg/200 mL premix (has no administration in time range)  lactated ringers bolus 1,000 mL (0 mLs Intravenous Stopped 07/14/23 1813)  potassium chloride SA (KLOR-CON M) CR tablet 40 mEq (40 mEq Oral Given 07/14/23 1847)  metroNIDAZOLE (FLAGYL) IVPB 500 mg (0 mg Intravenous Stopped 07/14/23 2018)  lactated ringers bolus 2,500 mL (0 mLs Intravenous Stopped 07/14/23 2035)    Mobility walks     Focused Assessments    R Recommendations: See Admitting Provider Note  Report given to:   Additional Notes: Patient is A&O, no complaints other than feeling bad. He has gotten all antibiotics and has 2 lines already. He's not hypotensive any more and instead is slightly hypertensive. I will be calling lab for his  urine culture.

## 2023-07-14 NOTE — ED Notes (Signed)
Patient transported to CT 

## 2023-07-14 NOTE — ED Provider Notes (Signed)
[place] EMERGENCY DEPARTMENT AT [facility] Provider Note   CSN: [id number] Arrival date & time: 07/14/23  1406     History  Chief Complaint  Patient presents with   Abnormal Lab    Adrian [name] is a 72 y.o. male.   Abnormal Lab Patient presents for fevers and chills.  Medical history includes HLD, HTN, CAD, DM.  3 days ago, patient had fatigue.  For the past 2 days, he has had intermittent subjective fevers and chills.  He has had some nausea without vomiting.  He went to urgent care today, prior to arrival.  They did some lab work there and told him that his white blood cell count was elevated.  They told that this is likely secondary to a bacterial infection that he needs to come to the ED for concern of sepsis.  On arrival in the ED, patient first noticed some blotchy erythema on his trunk.  He states that this is nonpainful and nonpruritic.  He denies any recent dysuria.  He has had a mild intermittent cough.  He does not check his blood sugar at home.  He has been drinking lots of fluid and has been urinating a lot lately.    Home Medications Prior to Admission medications   Medication Sig Start Date End Date Taking? Authorizing Provider  aspirin EC 81 MG tablet Take 81 mg by mouth daily.    [provider]  atorvastatin (LIPITOR) 40 MG tablet TAKE 1 TABLET BY MOUTH DAILY 07/01/23   Croitoru, Mihai, MD  gabapentin (NEURONTIN) 600 MG tablet Take 600 mg by mouth daily. 11/19/21   [provider]  hydrochlorothiazide (MICROZIDE) 12.5 MG capsule TAKE 1 CAPSULE BY MOUTH DAILY 11/25/22   Croitoru, Mihai, MD  losartan (COZAAR) 100 MG tablet Take 1 tablet (100 mg total) by mouth daily. 10/21/18   Croitoru, Mihai, MD  Polyethylene Glycol 3350 (MIRALAX PO) Take by mouth as needed.    [provider]      Allergies    Patient has no known allergies.    Review of Systems   Review of Systems  Constitutional:  Positive for chills and fever.   Gastrointestinal:  Positive for nausea.  Endocrine: Positive for polydipsia and polyuria.  All other systems reviewed and are negative.   Physical Exam Updated Vital Signs BP 105/61 (BP Location: Right Arm)   Pulse 71   Temp 98.1 F (36.7 C)   Resp 20   Ht 5\' 9"  (1.753 m)   Wt 115.2 kg   SpO2 93%   BMI 37.51 kg/m  Physical Exam Vitals and nursing note reviewed.  Constitutional:      General: He is not in acute distress.    Appearance: Normal appearance. He is well-developed. He is not ill-appearing, toxic-appearing or diaphoretic.  HENT:     Head: Normocephalic and atraumatic.     Right Ear: External ear normal.     Left Ear: External ear normal.     Nose: Nose normal.     Mouth/Throat:     Mouth: Mucous membranes are moist.  Eyes:     Extraocular Movements: Extraocular movements intact.     Conjunctiva/sclera: Conjunctivae normal.  Cardiovascular:     Rate and Rhythm: Normal rate and regular rhythm.  Pulmonary:     Effort: Pulmonary effort is normal. No respiratory distress.     Breath sounds: No wheezing or rales.  Abdominal:     General: There is no distension.  Palpations: Abdomen is soft.     Tenderness: There is no abdominal tenderness.  Musculoskeletal:        General: No swelling. Normal range of motion.     Cervical back: Normal range of motion and neck supple.     Right lower leg: No edema.     Left lower leg: No edema.  Skin:    General: Skin is warm and dry.     Findings: Erythema present.  Neurological:     General: No focal deficit present.     Mental Status: He is alert and oriented to person, place, and time.  Psychiatric:        Mood and Affect: Mood normal.        Behavior: Behavior normal.     ED Results / Procedures / Treatments   Labs (all labs ordered are listed, but only abnormal results are displayed) Labs Reviewed  BASIC METABOLIC PANEL - Abnormal; Notable for the following components:      Result Value   Sodium 132 (*)     Potassium 3.3 (*)    CO2 21 (*)    Glucose, Bld 218 (*)    BUN 27 (*)    Creatinine, Ser 1.80 (*)    GFR, Estimated 39 (*)    All other components within normal limits  CBC - Abnormal; Notable for the following components:   WBC 16.3 (*)    Platelets 146 (*)    All other components within normal limits  CBG MONITORING, ED - Abnormal; Notable for the following components:   Glucose-Capillary 236 (*)    All other components within normal limits  CBG MONITORING, ED - Abnormal; Notable for the following components:   Glucose-Capillary 219 (*)    All other components within normal limits  I-STAT CG4 LACTIC ACID, ED - Abnormal; Notable for the following components:   Lactic Acid, Venous 2.6 (*)    All other components within normal limits  CULTURE, BLOOD (ROUTINE X 2)  CULTURE, BLOOD (ROUTINE X 2)  RESP PANEL BY RT-PCR (RSV, FLU A&B, COVID)  [id number]  URINALYSIS, ROUTINE W REFLEX MICROSCOPIC  HEPATIC FUNCTION PANEL  MAGNESIUM    EKG EKG Interpretation Date/Time:  Tuesday July 14 2023 14:13:09 EST Ventricular Rate:  71 PR Interval:  184 QRS Duration:  108 QT Interval:  412 QTC Calculation: 447 R Axis:   -72  Text Interpretation: Normal sinus rhythm Left axis deviation Abnormal ECG When compared with ECG of 30-Apr-2016 12:43, PREVIOUS ECG IS PRESENT Confirmed by Virgina Norfolk [phone]) on 07/14/2023 4:32:01 PM  Radiology DG Chest Portable 1 View  Result Date: 07/14/2023 CLINICAL DATA:  Chills with shakiness and leukocytosis. EXAM: PORTABLE CHEST 1 VIEW COMPARISON:  CT 04/30/2016.  Radiographs 04/30/2016 and 06/18/2011. FINDINGS: 1540 hours. The heart size and mediastinal contours are stable status post median sternotomy and CABG. Probable mild atelectasis at both lung bases, left greater than right. No confluent airspace disease, pleural effusion or pneumothorax. The bones appear unchanged. IMPRESSION: Probable mild bibasilar atelectasis. No definite evidence of pneumonia.  Electronically Signed   By: Carey Bullocks M.D.   On: 07/14/2023 17:16    Procedures Procedures    Medications Ordered in ED Medications  lactated ringers bolus 1,000 mL (has no administration in time range)  potassium chloride SA (KLOR-CON M) CR tablet 40 mEq (has no administration in time range)    ED Course/ Medical Decision Making/ A&P  Medical Decision Making Amount and/or Complexity of Data Reviewed Labs: ordered. Radiology: ordered.   This patient presents to the ED for concern of fevers, chills, nausea, this involves an extensive number of treatment options, and is a complaint that carries with it a high risk of complications and morbidity.  The differential diagnosis includes hyperglycemia, dehydration, bacterial infection, URI, polypharmacy, withdrawal   Co morbidities that complicate the patient evaluation  HLD, HTN, CAD, DM   Additional history obtained:  Additional history obtained from patient's wife External records from outside source obtained and reviewed including MAR   Lab Tests:  I Ordered, and personally interpreted labs.  The pertinent results include: Creatinine is increased when compared to lab work from 6 years ago.  Mild hypokalemia is present.  Patient does have confirmed leukocytosis.  Hemoglobin is normal.  Remaining lab work was pending at time of signout.   Imaging Studies ordered:  I ordered imaging studies including chest x-ray, CT of chest, abdomen, pelvis I independently visualized and interpreted imaging which showed no acute findings and x-ray, CT pending at time of signout. I agree with the radiologist interpretation   Cardiac Monitoring: / EKG:  The patient was maintained on a cardiac monitor.  I personally viewed and interpreted the cardiac monitored which showed an underlying rhythm of: Sinus rhythm   Problem List / ED Course / Critical interventions / Medication management  Patient  presents for fevers, chills, nausea for the past 2 days.  On arrival in the ED, he is afebrile with normal vital signs.  He is well-appearing on exam.  Breathing is unlabored and lungs are clear to auscultation.  He has no abdominal pain or tenderness.  He does have some erythematous spread on his trunk.  This area is blanchable and not raised.  It is not pruritic or painful.  Patient does state that he has had increased thirst and urination lately.  Initial glucose is only mildly elevated.  He was given IV fluids for hydration.  Workup was initiated to identify possible infectious source.  Lab work was notable for mild hypokalemia.  His creatinine is elevated when compared to lab work from 6 years ago.  Acuity of this is indeterminate.  He does have confirmed leukocytosis but this is nonspecific.  Remaining workup is pending at time of signout.  Care of patient was signed out to oncoming ED provider. I ordered medication including IV fluids for hydration; potassium chloride for hypokalemia Reevaluation of the patient after these medicines showed that the patient improved I have reviewed the patients home medicines and have made adjustments as needed   Social Determinants of Health:  Has access to outpatient care        Final Clinical Impression(s) / ED Diagnoses Final diagnoses:  Chills    Rx / DC Orders ED Discharge Orders     None         Gloris Manchester, MD 07/14/23 1730

## 2023-07-15 DIAGNOSIS — N179 Acute kidney failure, unspecified: Secondary | ICD-10-CM | POA: Diagnosis not present

## 2023-07-15 DIAGNOSIS — R6510 Systemic inflammatory response syndrome (SIRS) of non-infectious origin without acute organ dysfunction: Secondary | ICD-10-CM | POA: Diagnosis not present

## 2023-07-15 DIAGNOSIS — R0682 Tachypnea, not elsewhere classified: Secondary | ICD-10-CM | POA: Diagnosis not present

## 2023-07-15 DIAGNOSIS — D72829 Elevated white blood cell count, unspecified: Secondary | ICD-10-CM | POA: Diagnosis not present

## 2023-07-15 LAB — CBC
HCT: 37.3 % — ABNORMAL LOW (ref 39.0–52.0)
Hemoglobin: 12.8 g/dL — ABNORMAL LOW (ref 13.0–17.0)
MCH: 28.7 pg (ref 26.0–34.0)
MCHC: 34.3 g/dL (ref 30.0–36.0)
MCV: 83.6 fL (ref 80.0–100.0)
Platelets: 101 10*3/uL — ABNORMAL LOW (ref 150–400)
RBC: 4.46 MIL/uL (ref 4.22–5.81)
RDW: 13.1 % (ref 11.5–15.5)
WBC: 8.7 10*3/uL (ref 4.0–10.5)
nRBC: 0 % (ref 0.0–0.2)

## 2023-07-15 LAB — BASIC METABOLIC PANEL
Anion gap: 10 (ref 5–15)
BUN: 22 mg/dL (ref 8–23)
CO2: 24 mmol/L (ref 22–32)
Calcium: 8.4 mg/dL — ABNORMAL LOW (ref 8.9–10.3)
Chloride: 102 mmol/L (ref 98–111)
Creatinine, Ser: 1.51 mg/dL — ABNORMAL HIGH (ref 0.61–1.24)
GFR, Estimated: 49 mL/min — ABNORMAL LOW (ref >60)
Glucose, Bld: 173 mg/dL — ABNORMAL HIGH (ref 70–99)
Potassium: 3 mmol/L — ABNORMAL LOW (ref 3.5–5.1)
Sodium: 136 mmol/L (ref 135–145)

## 2023-07-15 LAB — HEMOGLOBIN A1C
Hgb A1c MFr Bld: 6.8 % — ABNORMAL HIGH (ref 4.8–5.6)
Mean Plasma Glucose: 148.46 mg/dL

## 2023-07-15 LAB — GLUCOSE, CAPILLARY
Glucose-Capillary: 183 mg/dL — ABNORMAL HIGH (ref 70–99)
Glucose-Capillary: 166 mg/dL — ABNORMAL HIGH (ref 70–99)
Glucose-Capillary: 148 mg/dL — ABNORMAL HIGH (ref 70–99)
Glucose-Capillary: 157 mg/dL — ABNORMAL HIGH (ref 70–99)

## 2023-07-15 LAB — HEPATIC FUNCTION PANEL
ALT: 22 U/L (ref 0–44)
AST: 27 U/L (ref 15–41)
Albumin: 2.9 g/dL — ABNORMAL LOW (ref 3.5–5.0)
Alkaline Phosphatase: 42 U/L (ref 38–126)
Bilirubin, Direct: 0.3 mg/dL — ABNORMAL HIGH (ref 0.0–0.2)
Indirect Bilirubin: 0.9 mg/dL (ref 0.3–0.9)
Total Bilirubin: 1.2 mg/dL — ABNORMAL HIGH (ref <1.2)
Total Protein: 5.9 g/dL — ABNORMAL LOW (ref 6.5–8.1)

## 2023-07-15 LAB — MRSA NEXT GEN BY PCR, NASAL: MRSA by PCR Next Gen: NOT DETECTED

## 2023-07-15 MED ORDER — SODIUM CHLORIDE 0.9 % IV SOLN
2.0000 g | INTRAVENOUS | Status: DC
  Administered 2023-07-16: 2 g via INTRAVENOUS
  Filled 2023-07-15: qty 20

## 2023-07-15 MED ORDER — GUAIFENESIN-DM 100-10 MG/5ML PO SYRP
5.0000 mL | ORAL_SOLUTION | ORAL | Status: DC | PRN
  Administered 2023-07-15: 5 mL via ORAL
  Filled 2023-07-15: qty 5

## 2023-07-15 MED ORDER — POTASSIUM CHLORIDE CRYS ER 20 MEQ PO TBCR
40.0000 meq | EXTENDED_RELEASE_TABLET | Freq: Once | ORAL | Status: AC
  Administered 2023-07-15: 40 meq via ORAL
  Filled 2023-07-15: qty 2

## 2023-07-15 NOTE — TOC CM/SW Note (Signed)
Transition of Care [facility]) - Inpatient Brief Assessment   Patient Details  Name: BLISS FESSEL MRN: [id number] Date of Birth: June 10, 1951  Transition of Care [facility]) CM/SW Contact:    Harriet Masson, RN Phone Number: 07/15/2023, 12:11 PM   Clinical Narrative: Patient admitted for SIRs and receiving IV antibiotics.  No TOC needs at this time. TOC following.   Transition of Care Asessment: Insurance and Status: Insurance coverage has been reviewed Patient has primary care physician: Yes Home environment has been reviewed: safe to discharge home Prior level of function:: independent Prior/Current Home Services: No current home services Social Determinants of Health Reivew: SDOH reviewed no interventions necessary Readmission risk has been reviewed: Yes Transition of care needs: no transition of care needs at this time

## 2023-07-15 NOTE — Progress Notes (Signed)
Attempted to pull out tape on the right AC post blood drawing site. Minimal bleeding noted, instructed to leave it for now. Bleeding controlled with pressure dressing. Continue to monitor.

## 2023-07-15 NOTE — Progress Notes (Signed)
PROGRESS NOTE    Adrian [name]  ZOX:[id number] DOB: 10-19-1950 DOA: 07/14/2023 PCP: Ollen Bowl, MD  Chief Complaint  Patient presents with   Abnormal Lab    Hospital Course:  Adrian [name] is 72 y.o. male with hypertension, hyperlipidemia, diabetes, CAD with CABG in 2011.  Patient presented to the ED on 11/26 complaining of chills and malaise.  To the ED patient was found to be septic with tachypnea, hypotension, leukocytosis of 16.3 K, and an AKI.  He was admitted for further workup.   Subjective: No fever overnight. BP improving. Pt reports feeling better this morning.  He is tolerating his diet.  No nausea no vomiting.  Rash on his back appears to be spreading, but he has no complaints, and is not itchy.    Objective: Vitals:   07/15/23 0000 07/15/23 0200 07/15/23 0400 07/15/23 0600  BP: (!) 143/74  135/65   Pulse: 65 69 70 71  Resp: 20 20 20 15   Temp:   98.8 F (37.1 C)   TempSrc:   Oral   SpO2: 95% 94% 93% 95%  Weight:   114.2 kg   Height:        Intake/Output Summary (Last 24 hours) at 07/15/2023 0722 Last data filed at 07/15/2023 8119 Gross per 24 hour  Intake 4680 ml  Output 900 ml  Net 3780 ml   Filed Weights   07/14/23 1411 07/14/23 2257 07/15/23 0400  Weight: 115.2 kg 114.3 kg 114.2 kg    Examination: General exam: Appears calm and comfortable, NAD Respiratory system: No work of breathing, symmetric chest wall expansion Cardiovascular system: S1 & S2 heard, RRR.  Gastrointestinal system: Abdomen is nondistended, soft and nontender.  Neuro: Alert and oriented. No focal neurological deficits. Extremities: Symmetric, expected ROM Skin: Diffuse maculopapular rash over back, spreading to the superior portion of his abdomen.  Excoriations, does have some warmth to touch Psychiatry: Demonstrates appropriate judgement and insight. Mood & affect appropriate for situation.   Assessment & Plan:  Principal Problem:   SIRS (systemic inflammatory  response syndrome) (HCC) Active Problems:   Dyslipidemia   Hypertension   AKI (acute kidney injury) (HCC)   Hypokalemia   Coronary artery disease involving native heart  SIRS Lactic acidosis - Diagnosed on arrival with tachycardia, tachypnea, leukocytosis.  Source not immediately identified -Chest x-ray, UA unremarkable. CT chest abdomen pelvis, reviewed.  No obvious infectious findings. - Covid/Flu neg. - Follow blood cultures currently negative - Initially started on cefepime, Flagyl, vancomycin.  MRSA PCR negative, stop vancomycin.  Diabetes well-controlled, no obvious indication for Pseudomonas coverage.  Will discontinue cefepime and proceed with ceftriaxone only.  No concerns for intra-abdominal processes negative CT, will discontinue Flagyl.  - Status post 3.5 L IV fluids - Lactic acid resolving  Rash -- Consistent with viral exanthem, nonspecific.  No clear etiology.  Will continue to monitor.  Is not bothersome to patient at this time.  No new medications.  AKI - Creatinine on arrival 1.8, baseline appears to be closer to 1.1 - Suspect this is secondary to sepsis - CT reviewed, no hydronephrosis appreciated. - Status post fluid resuscitation in the ED - Takes ARB and HCTZ at home, will hold for now. - Avoid nephrotoxic meds - Repeat CMP daily until baseline  CAD - History of CABG in 2011 - Currently stable, no anginal symptoms - Continue home dose aspirin and Lipitor  Hypokalemia - Replace as needed - Follow CMP  Type 2 diabetes with hyperglycemia -  Most recent hemoglobin A1c 6.5% - Diet controlled at home, no meds - Continue with low-dose sliding scale, titrate up as tolerated  Thrombocytopenia -146 on arrival, down to 101 this morning. Baseline appears normal compared to 2018 labs - Suspect this is secondary to sepsis.  Expect resolution with infection control - Monitor platelet count, hold heparin given downtrend this AM. SCDS ordered  Normocytic anemia -  Hemoglobin 12.8, suspect this is dilutional as it was 14 on arrival. - Continue to trend and monitor for acute bleeding  BMI 37 -Outpatient follow up for lifestyle modification and risk factor management  Fatty liver - Incidentally seen on CT -Outpatient follow-up for surveillance and risk factor modification.  Sigmoid diverticulosis --Incidentally seen on CT --No active inflammatory changes, patient follow-up  Cholelithiasis -Incidentally seen on CT -No evidence of cholecystitis   DVT prophylaxis: SCDs Code Status: Full Family Communication: Wife present at bedside for entire discussion. Disposition:  Status is: Inpatient Remains inpatient appropriate because: Ongoing medical care.  IV antibiotics.    Consultants:  none  Procedures:  None  Antimicrobials:  Anti-infectives (From admission, onward)    Start     Dose/Rate Route Frequency Ordered Stop   07/15/23 1800  vancomycin (VANCOCIN) IVPB 1000 mg/200 mL premix        1,000 mg 200 mL/hr over 60 Minutes Intravenous Every 24 hours 07/14/23 2151 07/20/23 2359   07/15/23 0615  metroNIDAZOLE (FLAGYL) IVPB 500 mg        500 mg 100 mL/hr over 60 Minutes Intravenous Every 12 hours 07/14/23 2145 07/22/23 0614   07/15/23 0600  ceFEPIme (MAXIPIME) 2 g in sodium chloride 0.9 % 100 mL IVPB        2 g 200 mL/hr over 30 Minutes Intravenous Every 12 hours 07/14/23 2151 07/20/23 2359   07/14/23 1815  ceFEPIme (MAXIPIME) 2 g in sodium chloride 0.9 % 100 mL IVPB        2 g 200 mL/hr over 30 Minutes Intravenous  Once 07/14/23 1800 07/14/23 2234   07/14/23 1815  metroNIDAZOLE (FLAGYL) IVPB 500 mg        500 mg 100 mL/hr over 60 Minutes Intravenous  Once 07/14/23 1800 07/14/23 2018   07/14/23 1815  vancomycin (VANCOCIN) IVPB 1000 mg/200 mL premix  Status:  Discontinued        1,000 mg 200 mL/hr over 60 Minutes Intravenous  Once 07/14/23 1800 07/14/23 1815   07/14/23 1815  vancomycin (VANCOCIN) 2,500 mg in sodium chloride 0.9 % 500  mL IVPB        2,500 mg 262.5 mL/hr over 120 Minutes Intravenous  Once 07/14/23 1815 07/14/23 2307       Data Reviewed: I have personally reviewed following labs and imaging studies CBC: Recent Labs  Lab 07/14/23 1415 07/15/23 0210  WBC 16.3* 8.7  HGB 14.7 12.8*  HCT 43.2 37.3*  MCV 83.9 83.6  PLT 146* 101*   Basic Metabolic Panel: Recent Labs  Lab 07/14/23 1415 07/14/23 1627 07/15/23 0210  NA 132*  --  136  K 3.3*  --  3.0*  CL 98  --  102  CO2 21*  --  24  GLUCOSE 218*  --  173*  BUN 27*  --  22  CREATININE 1.80*  --  1.51*  CALCIUM 9.0  --  8.4*  MG  --  2.1  --    GFR: Estimated Creatinine Clearance: 55.1 mL/min (A) (by C-G formula based on SCr of 1.51 mg/dL (H)). Liver  Function Tests: Recent Labs  Lab 07/14/23 1627 07/15/23 0210  AST 37 27  ALT 27 22  ALKPHOS 59 42  BILITOT 1.5* 1.2*  PROT 7.9 5.9*  ALBUMIN 3.8 2.9*   CBG: Recent Labs  Lab 07/14/23 1415 07/14/23 1553 07/14/23 2222 07/15/23 0605  GLUCAP 236* 219* 168* 183*    Recent Results (from the past 240 hour(s))  Resp panel by RT-PCR (RSV, Flu A&B, Covid) Anterior Nasal Swab     Status: None   Collection Time: 07/14/23  8:02 PM   Specimen: Anterior Nasal Swab  Result Value Ref Range Status   SARS Coronavirus 2 by RT PCR NEGATIVE NEGATIVE Final   Influenza A by PCR NEGATIVE NEGATIVE Final   Influenza B by PCR NEGATIVE NEGATIVE Final    Comment: (NOTE) The Xpert Xpress SARS-CoV-2/FLU/RSV plus assay is intended as an aid in the diagnosis of influenza from Nasopharyngeal swab specimens and should not be used as a sole basis for treatment. Nasal washings and aspirates are unacceptable for Xpert Xpress SARS-CoV-2/FLU/RSV testing.  Fact Sheet for Patients: BloggerCourse.com  Fact Sheet for Healthcare Providers: SeriousBroker.it  This test is not yet approved or cleared by the [place] FDA and has been authorized for detection  and/or diagnosis of SARS-CoV-2 by FDA under an Emergency Use Authorization (EUA). This EUA will remain in effect (meaning this test can be used) for the duration of the COVID-19 declaration under Section 564(b)(1) of the Act, 21 U.S.C. section 360bbb-3(b)(1), unless the authorization is terminated or revoked.     Resp Syncytial Virus by PCR NEGATIVE NEGATIVE Final    Comment: (NOTE) Fact Sheet for Patients: BloggerCourse.com  Fact Sheet for Healthcare Providers: SeriousBroker.it  This test is not yet approved or cleared by the [place] FDA and has been authorized for detection and/or diagnosis of SARS-CoV-2 by FDA under an Emergency Use Authorization (EUA). This EUA will remain in effect (meaning this test can be used) for the duration of the COVID-19 declaration under Section 564(b)(1) of the Act, 21 U.S.C. section 360bbb-3(b)(1), unless the authorization is terminated or revoked.  Performed at [facility], [address], [place], [place] [address]   MRSA Next Gen by PCR, Nasal     Status: None   Collection Time: 07/14/23 10:57 PM   Specimen: Nasal Mucosa; Nasal Swab  Result Value Ref Range Status   MRSA by PCR Next Gen NOT DETECTED NOT DETECTED Final    Comment: (NOTE) The GeneXpert MRSA Assay (FDA approved for NASAL specimens only), is one component of a comprehensive MRSA colonization surveillance program. It is not intended to diagnose MRSA infection nor to guide or monitor treatment for MRSA infections. Test performance is not FDA approved in patients less than 5 years old. Performed at [facility], [address], [place], [place] [address]      Radiology Studies: CT CHEST ABDOMEN PELVIS WO CONTRAST  Result Date: 07/14/2023 CLINICAL DATA:  Sepsis. EXAM: CT CHEST, ABDOMEN AND PELVIS WITHOUT CONTRAST TECHNIQUE: Multidetector CT imaging of the chest, abdomen and pelvis was performed following the  standard protocol without IV contrast. RADIATION DOSE REDUCTION: This exam was performed according to the departmental dose-optimization program which includes automated exposure control, adjustment of the mA and/or kV according to patient size and/or use of iterative reconstruction technique. COMPARISON:  CT abdomen pelvis dated 08/28/2016. FINDINGS: Evaluation of this exam is limited in the absence of intravenous contrast. CT CHEST FINDINGS Cardiovascular: There is no cardiomegaly or pericardial effusion.  Three-vessel coronary vascular calcification. Mild atherosclerotic calcification of the thoracic aorta. No aneurysmal dilatation. The central pulmonary arteries are grossly unremarkable. Mediastinum/Nodes: No hilar or mediastinal adenopathy. The esophagus is grossly unremarkable. No mediastinal fluid collection. Lungs/Pleura: Postsurgical changes of the right lung base. Bibasilar linear atelectasis/scarring. No consolidative changes. There is no pleural effusion pneumothorax. The central airways are patent. Musculoskeletal: Median sternotomy wires. No acute osseous pathology. CT ABDOMEN PELVIS FINDINGS No intra-abdominal free air or free fluid. Hepatobiliary: Mild fatty liver. No biliary dilatation. Gallstones. No pericholecystic fluid or evidence of acute cholecystitis by CT. Pancreas: Unremarkable. No pancreatic ductal dilatation or surrounding inflammatory changes. Spleen: Normal in size without focal abnormality. Adrenals/Urinary Tract: The adrenal glands unremarkable. There is no hydronephrosis or nephrolithiasis on either side. The visualized ureters and urinary bladder appear unremarkable. Stomach/Bowel: There is sigmoid diverticulosis without active inflammatory changes. There is no bowel obstruction or active inflammation. The appendix is not visualized with certainty. No inflammatory changes identified in the right lower quadrant. Vascular/Lymphatic: Mild aortoiliac atherosclerotic disease. The IVC is  unremarkable. No portal venous gas. There is no adenopathy. Reproductive: The prostate and seminal vesicles are grossly unremarkable. No pelvic mass. Other: None Musculoskeletal: Degenerative changes of the spine. No acute osseous pathology. IMPRESSION: 1. No acute intrathoracic, abdominal, or pelvic pathology. 2. Sigmoid diverticulosis. No bowel obstruction. 3. Cholelithiasis. 4. Mild fatty liver. 5.  Aortic Atherosclerosis (ICD10-I70.0). Electronically Signed   By: Elgie Collard M.D.   On: 07/14/2023 21:08   DG Chest Portable 1 View  Result Date: 07/14/2023 CLINICAL DATA:  Chills with shakiness and leukocytosis. EXAM: PORTABLE CHEST 1 VIEW COMPARISON:  CT 04/30/2016.  Radiographs 04/30/2016 and 06/18/2011. FINDINGS: 1540 hours. The heart size and mediastinal contours are stable status post median sternotomy and CABG. Probable mild atelectasis at both lung bases, left greater than right. No confluent airspace disease, pleural effusion or pneumothorax. The bones appear unchanged. IMPRESSION: Probable mild bibasilar atelectasis. No definite evidence of pneumonia. Electronically Signed   By: Carey Bullocks M.D.   On: 07/14/2023 17:16    Scheduled Meds:  aspirin EC  81 mg Oral Daily   atorvastatin  40 mg Oral Daily   heparin  5,000 Units Subcutaneous Q8H   insulin aspart  0-5 Units Subcutaneous QHS   insulin aspart  0-6 Units Subcutaneous TID WC   sodium chloride flush  10 mL Intravenous Q12H   sodium chloride flush  3 mL Intravenous Q12H   Continuous Infusions:  ceFEPime (MAXIPIME) IV     metronidazole     vancomycin       LOS: 1 day    Time spent:   Debarah Crape, DO [facility]  To contact the attending physician between 7A-7P please use Epic Chat. To contact the covering physician during after hours 7P-7A, please review Amion.   07/15/2023, 7:22 AM

## 2023-07-16 DIAGNOSIS — L509 Urticaria, unspecified: Secondary | ICD-10-CM | POA: Insufficient documentation

## 2023-07-16 DIAGNOSIS — N179 Acute kidney failure, unspecified: Secondary | ICD-10-CM | POA: Diagnosis not present

## 2023-07-16 DIAGNOSIS — R6510 Systemic inflammatory response syndrome (SIRS) of non-infectious origin without acute organ dysfunction: Secondary | ICD-10-CM | POA: Diagnosis not present

## 2023-07-16 DIAGNOSIS — D72829 Elevated white blood cell count, unspecified: Secondary | ICD-10-CM | POA: Diagnosis not present

## 2023-07-16 DIAGNOSIS — R0682 Tachypnea, not elsewhere classified: Secondary | ICD-10-CM | POA: Diagnosis not present

## 2023-07-16 LAB — CBC WITH DIFFERENTIAL/PLATELET
Abs Immature Granulocytes: 0.04 10*3/uL (ref 0.00–0.07)
Basophils Absolute: 0 10*3/uL (ref 0.0–0.1)
Basophils Relative: 0 %
Eosinophils Absolute: 0.4 10*3/uL (ref 0.0–0.5)
Eosinophils Relative: 5 %
HCT: 33.7 % — ABNORMAL LOW (ref 39.0–52.0)
Hemoglobin: 11.4 g/dL — ABNORMAL LOW (ref 13.0–17.0)
Immature Granulocytes: 1 %
Lymphocytes Relative: 9 %
Lymphs Abs: 0.7 10*3/uL (ref 0.7–4.0)
MCH: 28.6 pg (ref 26.0–34.0)
MCHC: 33.8 g/dL (ref 30.0–36.0)
MCV: 84.5 fL (ref 80.0–100.0)
Monocytes Absolute: 0.4 10*3/uL (ref 0.1–1.0)
Monocytes Relative: 5 %
Neutro Abs: 6.1 10*3/uL (ref 1.7–7.7)
Neutrophils Relative %: 80 %
Platelets: 105 10*3/uL — ABNORMAL LOW (ref 150–400)
RBC: 3.99 MIL/uL — ABNORMAL LOW (ref 4.22–5.81)
RDW: 13 % (ref 11.5–15.5)
WBC: 7.6 10*3/uL (ref 4.0–10.5)
nRBC: 0 % (ref 0.0–0.2)

## 2023-07-16 LAB — COMPREHENSIVE METABOLIC PANEL
ALT: 20 U/L (ref 0–44)
AST: 24 U/L (ref 15–41)
Albumin: 2.6 g/dL — ABNORMAL LOW (ref 3.5–5.0)
Alkaline Phosphatase: 49 U/L (ref 38–126)
Anion gap: 8 (ref 5–15)
BUN: 18 mg/dL (ref 8–23)
CO2: 26 mmol/L (ref 22–32)
Calcium: 8.3 mg/dL — ABNORMAL LOW (ref 8.9–10.3)
Chloride: 103 mmol/L (ref 98–111)
Creatinine, Ser: 1.28 mg/dL — ABNORMAL HIGH (ref 0.61–1.24)
GFR, Estimated: 59 mL/min — ABNORMAL LOW (ref >60)
Glucose, Bld: 172 mg/dL — ABNORMAL HIGH (ref 70–99)
Potassium: 3.1 mmol/L — ABNORMAL LOW (ref 3.5–5.1)
Sodium: 137 mmol/L (ref 135–145)
Total Bilirubin: 1 mg/dL (ref <1.2)
Total Protein: 5.8 g/dL — ABNORMAL LOW (ref 6.5–8.1)

## 2023-07-16 LAB — GLUCOSE, CAPILLARY
Glucose-Capillary: 177 mg/dL — ABNORMAL HIGH (ref 70–99)
Glucose-Capillary: 155 mg/dL — ABNORMAL HIGH (ref 70–99)

## 2023-07-16 LAB — PHOSPHORUS: Phosphorus: 2.3 mg/dL — ABNORMAL LOW (ref 2.5–4.6)

## 2023-07-16 LAB — MAGNESIUM: Magnesium: 1.7 mg/dL (ref 1.7–2.4)

## 2023-07-16 MED ORDER — POTASSIUM CHLORIDE CRYS ER 20 MEQ PO TBCR
40.0000 meq | EXTENDED_RELEASE_TABLET | Freq: Once | ORAL | Status: AC
  Administered 2023-07-16: 40 meq via ORAL
  Filled 2023-07-16: qty 2

## 2023-07-16 MED ORDER — DIPHENHYDRAMINE HCL 25 MG PO CAPS: 25.0000 mg | ORAL_CAPSULE | Freq: Four times a day (QID) | ORAL | 0 refills | Status: DC | PRN

## 2023-07-16 MED ORDER — FAMOTIDINE 20 MG PO TABS: 20.0000 mg | ORAL_TABLET | Freq: Two times a day (BID) | ORAL | 0 refills | Status: AC

## 2023-07-16 MED ORDER — FAMOTIDINE IN NACL 20-0.9 MG/50ML-% IV SOLN
20.0000 mg | Freq: Two times a day (BID) | INTRAVENOUS | Status: DC
  Administered 2023-07-16: 20 mg via INTRAVENOUS
  Filled 2023-07-16 (×2): qty 50

## 2023-07-16 MED ORDER — DIPHENHYDRAMINE-ZINC ACETATE 2-0.1 % EX CREA: 1.0000 | TOPICAL_CREAM | Freq: Three times a day (TID) | CUTANEOUS | 0 refills | Status: DC | PRN

## 2023-07-16 MED ORDER — DIPHENHYDRAMINE HCL 50 MG/ML IJ SOLN
25.0000 mg | Freq: Four times a day (QID) | INTRAMUSCULAR | Status: DC | PRN
Start: 2023-07-16 — End: 2023-07-16

## 2023-07-16 MED ORDER — SIMETHICONE 80 MG PO CHEW
80.0000 mg | CHEWABLE_TABLET | Freq: Four times a day (QID) | ORAL | Status: DC | PRN
  Administered 2023-07-16: 80 mg via ORAL
  Filled 2023-07-16: qty 1

## 2023-07-16 MED ORDER — DIPHENHYDRAMINE HCL 50 MG/ML IJ SOLN
25.0000 mg | Freq: Once | INTRAMUSCULAR | Status: AC
  Administered 2023-07-16: 25 mg via INTRAVENOUS
  Filled 2023-07-16: qty 1

## 2023-07-16 NOTE — Plan of Care (Signed)
  Problem: Coping: Goal: Ability to adjust to condition or change in health will improve Outcome: Progressing   Problem: Fluid Volume: Goal: Ability to maintain a balanced intake and output will improve Outcome: Progressing   Problem: Metabolic: Goal: Ability to maintain appropriate glucose levels will improve Outcome: Progressing   Problem: Skin Integrity: Goal: Risk for impaired skin integrity will decrease Outcome: Progressing   Problem: Tissue Perfusion: Goal: Adequacy of tissue perfusion will improve Outcome: Progressing   Problem: Fluid Volume: Goal: Hemodynamic stability will improve Outcome: Progressing   Problem: Clinical Measurements: Goal: Diagnostic test results will improve Outcome: Progressing

## 2023-07-16 NOTE — Discharge Summary (Signed)
Physician Discharge Summary  Adrian [name] [facility] YQI:[id number] DOB: 1950/11/14 DOA: 07/14/2023  PCP: Ollen Bowl, MD  Admit date: 07/14/2023 Discharge date: 07/16/2023  Admitted From: Home Disposition:  Home   Recommendations for Outpatient Follow-up:  Follow up with PCP and Cardiology   Discharge Condition: Stable CODE STATUS: Full  Diet recommendation: Heart healthy   Brief/Interim Summary: Adrian [name] is a 72 y.o. male with medical history significant for hypertension, hyperlipidemia, diet-controlled diabetes mellitus, and CAD with CABG in 2011 and presents with chills and general malaise.  He had episode of transient tachypnea and hypotension on admission.  There was concern for SIRS, patient was started on vancomycin, cefepime and Flagyl with workup for infection.  During his hospitalization, source of infection was not identified.  Blood cultures remained negative, CT chest, abdomen, pelvis was unremarkable, COVID/flu/RSV negative.  Patient's symptoms continue to improve.  There was also a note of truncal rash/hives at time of admission; initially, patient was unaware of the rash.  He and his family note that several days prior to admission, patient had picked up his prescription of atorvastatin at the pharmacy.  The pill was made from an different manufacturer and was different color and shape and size.  Has been taking it nightly.  They were unsure if it was medication induced as the timing of new Lipitor pill coincided with his symptoms of malaise and rash.  Rash could also be secondary to viral etiology.  On day of discharge, patient was given dose of IV Benadryl and IV Pepcid.  Rash on his back had some improvement.  He was eager to discharge home as it was Thanksgiving.  He was given prescriptions for Benadryl and Pepcid and to follow-up with his primary care physician as well as cardiology.  Reviewed reasons to return back to the hospital including worsening symptoms of malaise,  rash, fever.  Discharge Diagnoses:  Principal Problem:   SIRS (systemic inflammatory response syndrome) (HCC) Active Problems:   Hx of CABG   Dyslipidemia   Benign essential HTN   Hypertension   AKI (acute kidney injury) (HCC)   Hypokalemia   Coronary artery disease involving native heart   Hives  Sepsis ruled out  Discharge Instructions  Discharge Instructions     Call MD for:  difficulty breathing, headache or visual disturbances   Complete by: As directed    Call MD for:  extreme fatigue   Complete by: As directed    Call MD for:  hives   Complete by: As directed    Call MD for:  persistant dizziness or light-headedness   Complete by: As directed    Call MD for:  persistant nausea and vomiting   Complete by: As directed    Call MD for:  severe uncontrolled pain   Complete by: As directed    Call MD for:  temperature >100.4   Complete by: As directed    Diet - low sodium heart healthy   Complete by: As directed    Discharge instructions   Complete by: As directed    You were cared for by a hospitalist during your hospital stay. If you have any questions about your discharge medications or the care you received while you were in the hospital after you are discharged, you can call the unit and ask to speak with the hospitalist on call if the hospitalist that took care of you is not available. Once you are discharged, your primary care physician will handle any further  medical issues. Please note that NO REFILLS for any discharge medications will be authorized once you are discharged, as it is imperative that you return to your primary care physician (or establish a relationship with a primary care physician if you do not have one) for your aftercare needs so that they can reassess your need for medications and monitor your lab values.   Increase activity slowly   Complete by: As directed       Allergies as of 07/16/2023   No Known Allergies      Medication List      STOP taking these medications    atorvastatin 40 MG tablet Commonly known as: LIPITOR       TAKE these medications    aspirin EC 81 MG tablet Take 81 mg by mouth daily.   diphenhydrAMINE 25 mg capsule Commonly known as: BENADRYL Take 1 capsule (25 mg total) by mouth every 6 (six) hours as needed for itching or allergies (rash).   diphenhydrAMINE-zinc acetate cream Commonly known as: Benadryl Extra Strength Apply 1 Application topically 3 (three) times daily as needed for itching.   famotidine 20 MG tablet Commonly known as: PEPCID Take 1 tablet (20 mg total) by mouth 2 (two) times daily for 3 days.   hydrochlorothiazide 12.5 MG capsule Commonly known as: MICROZIDE TAKE 1 CAPSULE BY MOUTH DAILY   losartan 100 MG tablet Commonly known as: COZAAR Take 1 tablet (100 mg total) by mouth daily.   MIRALAX PO Take 17 g by mouth daily as needed (for constipation).        Follow-up Information     Pahwani, Kasandra Knudsen, MD Follow up.   Specialty: Internal Medicine Contact information: [address] [place] [address] [phone]         Thurmon Fair, MD Follow up.   Specialty: Cardiology Contact information: [address] [place] [address] [phone]                No Known Allergies     Procedures/Studies: CT CHEST ABDOMEN PELVIS WO CONTRAST  Result Date: 07/14/2023 CLINICAL DATA:  Sepsis. EXAM: CT CHEST, ABDOMEN AND PELVIS WITHOUT CONTRAST TECHNIQUE: Multidetector CT imaging of the chest, abdomen and pelvis was performed following the standard protocol without IV contrast. RADIATION DOSE REDUCTION: This exam was performed according to the departmental dose-optimization program which includes automated exposure control, adjustment of the mA and/or kV according to patient size and/or use of iterative reconstruction technique. COMPARISON:  CT abdomen pelvis dated 08/28/2016. FINDINGS: Evaluation of this exam is  limited in the absence of intravenous contrast. CT CHEST FINDINGS Cardiovascular: There is no cardiomegaly or pericardial effusion. Three-vessel coronary vascular calcification. Mild atherosclerotic calcification of the thoracic aorta. No aneurysmal dilatation. The central pulmonary arteries are grossly unremarkable. Mediastinum/Nodes: No hilar or mediastinal adenopathy. The esophagus is grossly unremarkable. No mediastinal fluid collection. Lungs/Pleura: Postsurgical changes of the right lung base. Bibasilar linear atelectasis/scarring. No consolidative changes. There is no pleural effusion pneumothorax. The central airways are patent. Musculoskeletal: Median sternotomy wires. No acute osseous pathology. CT ABDOMEN PELVIS FINDINGS No intra-abdominal free air or free fluid. Hepatobiliary: Mild fatty liver. No biliary dilatation. Gallstones. No pericholecystic fluid or evidence of acute cholecystitis by CT. Pancreas: Unremarkable. No pancreatic ductal dilatation or surrounding inflammatory changes. Spleen: Normal in size without focal abnormality. Adrenals/Urinary Tract: The adrenal glands unremarkable. There is no hydronephrosis or nephrolithiasis on either side. The visualized ureters and urinary bladder appear unremarkable. Stomach/Bowel: There is  sigmoid diverticulosis without active inflammatory changes. There is no bowel obstruction or active inflammation. The appendix is not visualized with certainty. No inflammatory changes identified in the right lower quadrant. Vascular/Lymphatic: Mild aortoiliac atherosclerotic disease. The IVC is unremarkable. No portal venous gas. There is no adenopathy. Reproductive: The prostate and seminal vesicles are grossly unremarkable. No pelvic mass. Other: None Musculoskeletal: Degenerative changes of the spine. No acute osseous pathology. IMPRESSION: 1. No acute intrathoracic, abdominal, or pelvic pathology. 2. Sigmoid diverticulosis. No bowel obstruction. 3. Cholelithiasis.  4. Mild fatty liver. 5.  Aortic Atherosclerosis (ICD10-I70.0). Electronically Signed   By: Elgie Collard M.D.   On: 07/14/2023 21:08   DG Chest Portable 1 View  Result Date: 07/14/2023 CLINICAL DATA:  Chills with shakiness and leukocytosis. EXAM: PORTABLE CHEST 1 VIEW COMPARISON:  CT 04/30/2016.  Radiographs 04/30/2016 and 06/18/2011. FINDINGS: 1540 hours. The heart size and mediastinal contours are stable status post median sternotomy and CABG. Probable mild atelectasis at both lung bases, left greater than right. No confluent airspace disease, pleural effusion or pneumothorax. The bones appear unchanged. IMPRESSION: Probable mild bibasilar atelectasis. No definite evidence of pneumonia. Electronically Signed   By: Carey Bullocks M.D.   On: 07/14/2023 17:16      Discharge Exam: Vitals:   07/16/23 0744 07/16/23 1106  BP: (!) 152/74 (!) 144/64  Pulse: 66 (!) 59  Resp: 20 20  Temp: 98.8 F (37.1 C) 98.7 F (37.1 C)  SpO2: 93% 98%    General: Pt is alert, awake, not in acute distress Cardiovascular: RRR, S1/S2 +, no edema Respiratory: CTA bilaterally, no wheezing, no rhonchi, no respiratory distress, no conversational dyspnea  Abdominal: Soft, NT, ND, bowel sounds + Extremities: no edema, no cyanosis Skin: Urticarial rash on back, chest and abdomen, bilateral thighs  Psych: Normal mood and affect, stable judgement and insight     The results of significant diagnostics from this hospitalization (including imaging, microbiology, ancillary and laboratory) are listed below for reference.     Microbiology: Recent Results (from the past 240 hour(s))  Blood culture (routine x 2)     Status: None (Preliminary result)   Collection Time: 07/14/23  3:37 PM   Specimen: BLOOD RIGHT HAND  Result Value Ref Range Status   Specimen Description BLOOD RIGHT HAND  Final   Special Requests   Final    BOTTLES DRAWN AEROBIC AND ANAEROBIC Blood Culture adequate volume   Culture   Final    NO  GROWTH 2 DAYS Performed at [facility], [address], [place], [place] [address]    Report Status PENDING  Incomplete  Blood culture (routine x 2)     Status: None (Preliminary result)   Collection Time: 07/14/23  4:33 PM   Specimen: BLOOD  Result Value Ref Range Status   Specimen Description BLOOD RIGHT ANTECUBITAL  Final   Special Requests   Final    BOTTLES DRAWN AEROBIC AND ANAEROBIC Blood Culture adequate volume   Culture   Final    NO GROWTH 2 DAYS Performed at [facility], [address], [place], [place] [address]    Report Status PENDING  Incomplete  Resp panel by RT-PCR (RSV, Flu A&B, Covid) Anterior Nasal Swab     Status: None   Collection Time: 07/14/23  8:02 PM   Specimen: Anterior Nasal Swab  Result Value Ref Range Status   SARS Coronavirus 2 by RT PCR NEGATIVE NEGATIVE Final   Influenza A by PCR NEGATIVE NEGATIVE Final  Influenza B by PCR NEGATIVE NEGATIVE Final    Comment: (NOTE) The Xpert Xpress SARS-CoV-2/FLU/RSV plus assay is intended as an aid in the diagnosis of influenza from Nasopharyngeal swab specimens and should not be used as a sole basis for treatment. Nasal washings and aspirates are unacceptable for Xpert Xpress SARS-CoV-2/FLU/RSV testing.  Fact Sheet for Patients: BloggerCourse.com  Fact Sheet for Healthcare Providers: SeriousBroker.it  This test is not yet approved or cleared by the [place] FDA and has been authorized for detection and/or diagnosis of SARS-CoV-2 by FDA under an Emergency Use Authorization (EUA). This EUA will remain in effect (meaning this test can be used) for the duration of the COVID-19 declaration under Section 564(b)(1) of the Act, 21 U.S.C. section 360bbb-3(b)(1), unless the authorization is terminated or revoked.     Resp Syncytial Virus by PCR NEGATIVE NEGATIVE Final    Comment: (NOTE) Fact Sheet for  Patients: BloggerCourse.com  Fact Sheet for Healthcare Providers: SeriousBroker.it  This test is not yet approved or cleared by the [place] FDA and has been authorized for detection and/or diagnosis of SARS-CoV-2 by FDA under an Emergency Use Authorization (EUA). This EUA will remain in effect (meaning this test can be used) for the duration of the COVID-19 declaration under Section 564(b)(1) of the Act, 21 U.S.C. section 360bbb-3(b)(1), unless the authorization is terminated or revoked.  Performed at [facility], [address] [place], [place] [address]   MRSA Next Gen by PCR, Nasal     Status: None   Collection Time: 07/14/23 10:57 PM   Specimen: Nasal Mucosa; Nasal Swab  Result Value Ref Range Status   MRSA by PCR Next Gen NOT DETECTED NOT DETECTED Final    Comment: (NOTE) The GeneXpert MRSA Assay (FDA approved for NASAL specimens only), is one component of a comprehensive MRSA colonization surveillance program. It is not intended to diagnose MRSA infection nor to guide or monitor treatment for MRSA infections. Test performance is not FDA approved in patients less than 78 years old. Performed at [facility], [address] [place], [place] [address]      Labs: BNP (last 3 results) No results for input(s): "BNP" in the last 8760 hours. Basic Metabolic Panel: Recent Labs  Lab 07/14/23 1415 07/14/23 1627 07/15/23 0210 07/16/23 0206  NA 132*  --  136 137  K 3.3*  --  3.0* 3.1*  CL 98  --  102 103  CO2 21*  --  24 26  GLUCOSE 218*  --  173* 172*  BUN 27*  --  22 18  CREATININE 1.80*  --  1.51* 1.28*  CALCIUM 9.0  --  8.4* 8.3*  MG  --  2.1  --  1.7  PHOS  --   --   --  2.3*   Liver Function Tests: Recent Labs  Lab 07/14/23 1627 07/15/23 0210 07/16/23 0206  AST 37 27 24  ALT 27 22 20   ALKPHOS 59 42 49  BILITOT 1.5* 1.2* 1.0  PROT 7.9 5.9* 5.8*  ALBUMIN 3.8 2.9* 2.6*   No results for  input(s): "LIPASE", "AMYLASE" in the last 168 hours. No results for input(s): "AMMONIA" in the last 168 hours. CBC: Recent Labs  Lab 07/14/23 1415 07/15/23 0210 07/16/23 0206  WBC 16.3* 8.7 7.6  NEUTROABS  --   --  6.1  HGB 14.7 12.8* 11.4*  HCT 43.2 37.3* 33.7*  MCV 83.9 83.6 84.5  PLT 146* 101* 105*   Cardiac Enzymes:  No results for input(s): "CKTOTAL", "CKMB", "CKMBINDEX", "TROPONINI" in the last 168 hours. BNP: Invalid input(s): "POCBNP" CBG: Recent Labs  Lab 07/15/23 1105 07/15/23 1536 07/15/23 2100 07/16/23 0608 07/16/23 1105  GLUCAP 166* 148* 157* 177* 155*   D-Dimer No results for input(s): "DDIMER" in the last 72 hours. Hgb A1c Recent Labs    07/15/23 0210  HGBA1C 6.8*   Lipid Profile No results for input(s): "CHOL", "HDL", "LDLCALC", "TRIG", "CHOLHDL", "LDLDIRECT" in the last 72 hours. Thyroid function studies No results for input(s): "TSH", "T4TOTAL", "T3FREE", "THYROIDAB" in the last 72 hours.  Invalid input(s): "FREET3" Anemia work up No results for input(s): "VITAMINB12", "FOLATE", "FERRITIN", "TIBC", "IRON", "RETICCTPCT" in the last 72 hours. Urinalysis    Component Value Date/Time   COLORURINE YELLOW 07/14/2023 1953   APPEARANCEUR HAZY (A) 07/14/2023 1953   LABSPEC 1.013 07/14/2023 1953   PHURINE 5.0 07/14/2023 1953   GLUCOSEU NEGATIVE 07/14/2023 1953   HGBUR SMALL (A) 07/14/2023 1953   BILIRUBINUR NEGATIVE 07/14/2023 1953   KETONESUR NEGATIVE 07/14/2023 1953   PROTEINUR NEGATIVE 07/14/2023 1953   NITRITE NEGATIVE 07/14/2023 1953   LEUKOCYTESUR TRACE (A) 07/14/2023 1953   Sepsis Labs Recent Labs  Lab 07/14/23 1415 07/15/23 0210 07/16/23 0206  WBC 16.3* 8.7 7.6   Microbiology Recent Results (from the past 240 hour(s))  Blood culture (routine x 2)     Status: None (Preliminary result)   Collection Time: 07/14/23  3:37 PM   Specimen: BLOOD RIGHT HAND  Result Value Ref Range Status   Specimen Description BLOOD RIGHT HAND  Final    Special Requests   Final    BOTTLES DRAWN AEROBIC AND ANAEROBIC Blood Culture adequate volume   Culture   Final    NO GROWTH 2 DAYS Performed at [facility], [address], [place], [place] [address]    Report Status PENDING  Incomplete  Blood culture (routine x 2)     Status: None (Preliminary result)   Collection Time: 07/14/23  4:33 PM   Specimen: BLOOD  Result Value Ref Range Status   Specimen Description BLOOD RIGHT ANTECUBITAL  Final   Special Requests   Final    BOTTLES DRAWN AEROBIC AND ANAEROBIC Blood Culture adequate volume   Culture   Final    NO GROWTH 2 DAYS Performed at [facility], [address], [place], [place] [address]    Report Status PENDING  Incomplete  Resp panel by RT-PCR (RSV, Flu A&B, Covid) Anterior Nasal Swab     Status: None   Collection Time: 07/14/23  8:02 PM   Specimen: Anterior Nasal Swab  Result Value Ref Range Status   SARS Coronavirus 2 by RT PCR NEGATIVE NEGATIVE Final   Influenza A by PCR NEGATIVE NEGATIVE Final   Influenza B by PCR NEGATIVE NEGATIVE Final    Comment: (NOTE) The Xpert Xpress SARS-CoV-2/FLU/RSV plus assay is intended as an aid in the diagnosis of influenza from Nasopharyngeal swab specimens and should not be used as a sole basis for treatment. Nasal washings and aspirates are unacceptable for Xpert Xpress SARS-CoV-2/FLU/RSV testing.  Fact Sheet for Patients: BloggerCourse.com  Fact Sheet for Healthcare Providers: SeriousBroker.it  This test is not yet approved or cleared by the [place] FDA and has been authorized for detection and/or diagnosis of SARS-CoV-2 by FDA under an Emergency Use Authorization (EUA). This EUA will remain in effect (meaning this test can be used) for the duration of the COVID-19 declaration under Section 564(b)(1) of the  Act, 21 U.S.C. section 360bbb-3(b)(1), unless the authorization is terminated or revoked.     Resp  Syncytial Virus by PCR NEGATIVE NEGATIVE Final    Comment: (NOTE) Fact Sheet for Patients: BloggerCourse.com  Fact Sheet for Healthcare Providers: SeriousBroker.it  This test is not yet approved or cleared by the [place] FDA and has been authorized for detection and/or diagnosis of SARS-CoV-2 by FDA under an Emergency Use Authorization (EUA). This EUA will remain in effect (meaning this test can be used) for the duration of the COVID-19 declaration under Section 564(b)(1) of the Act, 21 U.S.C. section 360bbb-3(b)(1), unless the authorization is terminated or revoked.  Performed at [facility], [address], [place], [place] [address]   MRSA Next Gen by PCR, Nasal     Status: None   Collection Time: 07/14/23 10:57 PM   Specimen: Nasal Mucosa; Nasal Swab  Result Value Ref Range Status   MRSA by PCR Next Gen NOT DETECTED NOT DETECTED Final    Comment: (NOTE) The GeneXpert MRSA Assay (FDA approved for NASAL specimens only), is one component of a comprehensive MRSA colonization surveillance program. It is not intended to diagnose MRSA infection nor to guide or monitor treatment for MRSA infections. Test performance is not FDA approved in patients less than 72 years old. Performed at [facility], [address], [place], [place] [address]      Patient was seen and examined on the day of discharge and was found to be in stable condition. Time coordinating discharge: 40 minutes including assessment and coordination of care, as well as examination of the patient.   SIGNED:  Noralee Stain, DO [facility] 07/16/2023, 1:30 PM

## 2023-07-16 NOTE — Progress Notes (Signed)
[facility] night cross cover note:   I was notified by RN of the patient's morning potassium level 3.1.  I subsequently ordered potassium chloride 40 meq p.o. x 1 dose now.    Newton Pigg, DO Hospitalist

## 2023-07-16 NOTE — Progress Notes (Signed)
[facility] night cross cover note:   I was notified by RN at the patient's request for simethicone for gas.  I subsequently placed an order for prn simethicone for this purpose.    Newton Pigg, DO Hospitalist

## 2023-07-19 DIAGNOSIS — E109 Type 1 diabetes mellitus without complications: Secondary | ICD-10-CM | POA: Diagnosis not present

## 2023-07-19 LAB — CULTURE, BLOOD (ROUTINE X 2)
Culture: NO GROWTH
Culture: NO GROWTH
Special Requests: ADEQUATE
Special Requests: ADEQUATE

## 2023-07-22 DIAGNOSIS — D649 Anemia, unspecified: Secondary | ICD-10-CM | POA: Diagnosis not present

## 2023-07-22 DIAGNOSIS — N179 Acute kidney failure, unspecified: Secondary | ICD-10-CM | POA: Diagnosis not present

## 2023-07-22 DIAGNOSIS — E876 Hypokalemia: Secondary | ICD-10-CM | POA: Diagnosis not present

## 2023-07-22 DIAGNOSIS — L509 Urticaria, unspecified: Secondary | ICD-10-CM | POA: Diagnosis not present

## 2023-07-22 DIAGNOSIS — I700 Atherosclerosis of aorta: Secondary | ICD-10-CM | POA: Diagnosis not present

## 2023-07-22 DIAGNOSIS — D696 Thrombocytopenia, unspecified: Secondary | ICD-10-CM | POA: Diagnosis not present

## 2023-07-22 DIAGNOSIS — K8020 Calculus of gallbladder without cholecystitis without obstruction: Secondary | ICD-10-CM | POA: Diagnosis not present

## 2023-11-16 ENCOUNTER — Ambulatory Visit: Payer: Medicare Other | Attending: Cardiovascular Disease | Admitting: Cardiovascular Disease

## 2023-11-16 ENCOUNTER — Other Ambulatory Visit: Payer: Self-pay | Admitting: Cardiovascular Disease

## 2023-11-16 ENCOUNTER — Encounter: Payer: Self-pay | Admitting: Cardiovascular Disease

## 2023-11-16 VITALS — BP 162/80 | HR 75 | Wt 245.0 lb

## 2023-11-16 DIAGNOSIS — I10 Essential (primary) hypertension: Secondary | ICD-10-CM | POA: Diagnosis not present

## 2023-11-16 DIAGNOSIS — E782 Mixed hyperlipidemia: Secondary | ICD-10-CM

## 2023-11-16 DIAGNOSIS — I453 Trifascicular block: Secondary | ICD-10-CM

## 2023-11-16 DIAGNOSIS — E118 Type 2 diabetes mellitus with unspecified complications: Secondary | ICD-10-CM

## 2023-11-16 DIAGNOSIS — I25810 Atherosclerosis of coronary artery bypass graft(s) without angina pectoris: Secondary | ICD-10-CM

## 2023-11-16 MED ORDER — HYDROCHLOROTHIAZIDE 12.5 MG PO CAPS: 12.5000 mg | ORAL_CAPSULE | Freq: Every day | ORAL | 3 refills | Status: AC

## 2023-11-16 MED ORDER — LOVASTATIN 40 MG PO TABS: 40.0000 mg | ORAL_TABLET | Freq: Every day | ORAL | 3 refills | Status: AC

## 2023-11-16 NOTE — Progress Notes (Signed)
 Cardiology Office Note:    Date:  11/16/2023   ID:  Driscilla Grammes, DOB [birth year], MRN [id number]  PCP:  Ollen Bowl, MD  Cardiologist:  Thurmon Fair, MD   Referring MD: Ollen Bowl, MD   Chief Complaint  Patient presents with   Coronary Artery Disease  CAD  History of Present Illness:    Adrian [name] is a 73 y.o. male with a hx of moderate to severe obesity and has hypertension and hyperlipidemia and multivessel coronary disease for which he underwent bypass surgery in September 2011 (LIMA to LAD, SVG to ramus intermediate, SVG to circumflex marginal, SVG to posterior descending branch of the right coronary artery, EVH from a left side by Dr. Donata Clay ).  He was critically ill in November.  He presented with fever, chills, a generalized nonpruritic rash and developed profound hypotension consistent with SIRS.  No cause of sepsis was identified.  He had changed his atorvastatin to a new generic product, from a different manufacturer, just 3 days before this occurred and his symptoms started occurring shortly after he took the first dose of any medication.  He was therefore taken off atorvastatin.  He was placed on Zetia but this caused leg discomfort and weakness.  He tried pravastatin but this caused abdominal pain and diarrhea.  He is currently not taking any lipid-lowering agents.  Otherwise he has recovered completely from the acute illness and denies any issues of chest pain or shortness of breath either at rest or with exertion, edema, orthopnea, PND, palpitations, dizziness or syncope.  Continues to exercise about half an hour daily on his bicycle.  Last year, due for discontinue atorvastatin his LDL is not flat at target at 75.  Hemoglobin A1c was 6.8%, without any medications for diabetes.  He reports that at home his blood pressure when he is fully at rest is usually in the 130-140/80 range.  He had a normal nuclear stress test in April 2018.  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  His ECG shows chronic T wave inversion in the precordial leads V1-V3.    Past Medical History:  Diagnosis Date   CAD (coronary artery disease)    Chest wall pain following surgery    Diabetes mellitus without complication (HCC)    pt states that he is not a diabetic; and does not take any diabetes medication   Dyspnea    went to ED for SOB in january   Hyperlipidemia    Hypertension    Myocardial infarction [facility])    2011   Pleural effusion, left     Past Surgical History:  Procedure Laterality Date   CARDIAC CATHETERIZATION  04/29/10   LEFT MAIN-FAIRLY SHORT VESSEL, FREE OF DISEASE. LAD 70-90%. A CERTAIN SEGMENT APPEARS SLIGHTLY ULCERATED, THERE MAYBE ACTIVE RUPTURED PLAQUE. RAMUS INTERMEDIUS 95% IN THE PROXIMAL THIRD. LEFT CX 70-80% STENOSIS IN THE MID OM. RCA 40-50% OSTIAL STENOSIS. REFERRED FOR CABG.   CAROTID DUPLEX Bilateral 04/29/10   NO ICA STENOSIS BIL.+   CORONARY ARTERY BYPASS GRAFT  04/30/10   X4   Coronary artery bypass grafting  05/01/2010   lumbar laminectomy in 2006 by Dr. Charlesetta Ivory LAMINECTOMY/DECOMPRESSION MICRODISCECTOMY Left 12/17/2016   Procedure: Microlumbar decompression L2-L3;  Surgeon: Jene Every, MD;  Location: [facility];  Service: Orthopedics;  Laterality: Left;  Requests for 2 hrs   NUCLEAR STRESS TEST N/A 03/01/10   NORMAL PERFUSION PATTERN IN ALL REGIONS. EF 69%.NL MYOCARDIAL PERFUSION STUDY.   TRANSTHORACIC ECHOCARDIOGRAM N/A 04/27/10   LV SIZE IS NORMAL. SYSTOLIC FUNC IS NORMAL.    Current Medications: Current Meds  Medication Sig   aspirin EC 81 MG tablet Take 81 mg by mouth daily.   losartan (COZAAR) 100 MG tablet Take 1 tablet (100 mg total) by mouth daily.   lovastatin (MEVACOR) 40 MG tablet Take 1 tablet (40 mg total) by mouth at  bedtime.   [DISCONTINUED] hydrochlorothiazide (MICROZIDE) 12.5 MG capsule TAKE 1 CAPSULE BY MOUTH DAILY     Allergies:   Patient has no known allergies.   Social History   Socioeconomic History   Marital status: Married    Spouse name: Not on file   Number of children: Not on file   Years of education: Not on file   Highest education level: Not on file  Occupational History   Not on file  Tobacco Use   Smoking status: Never   Smokeless tobacco: Never  Substance and Sexual Activity   Alcohol use: Yes    Comment: 3x a week   Drug use: No   Sexual activity: Not on file  Other Topics Concern   Not on file  Social History Narrative   Not on file   Social Drivers of Health   Financial Resource Strain: Not on file  Food Insecurity: No Food Insecurity (07/14/2023)   Hunger Vital Sign    Worried About Running Out of Food in the Last Year: Never true    Ran Out of Food in the Last Year: Never true  Transportation Needs: No Transportation Needs (07/14/2023)   PRAPARE - Administrator, Civil Service (Medical): No    Lack of Transportation (Non-Medical): No  Physical Activity: Not on file  Stress: Not on file  Social Connections: Not on file     Family History: The patient's Family history is unknown by patient.  ROS:   Please see the history of present illness.    All other systems reviewed and are negative  EKGs/Labs/Other Studies Reviewed:    The following studies were reviewed today:   EKG:    EKG Interpretation Date/Time:  Monday November 16 2023 10:06:00 EDT Ventricular Rate:  72 PR Interval:  234 QRS Duration:  114 QT Interval:  444 QTC Calculation: 486 R Axis:   -38  Text Interpretation: Sinus rhythm with 1st degree A-V block Left axis deviation RSR' or QR pattern in V1 suggests right ventricular conduction delay Nonspecific ST abnormality Prolonged QT When compared with ECG of 14-Jul-2023 14:13, PR interval has increased Confirmed by Zared Knoth,  Caralina Nop ([id number]) on 11/16/2023 10:09:46 AM        Recent Labs: 07/16/2023: ALT 20; BUN 18; Creatinine, Ser 1.28; Hemoglobin 11.4; Magnesium 1.7; Platelets 105; Potassium 3.1; Sodium 137  hemoglobin A1c 6.8%  Recent Lipid Panel    Component Value Date/Time   CHOL (H) 04/27/2010 0747    223        ATP III CLASSIFICATION:  <200     mg/dL   Desirable  161-096  mg/dL   Borderline High  >=045    mg/dL   High          TRIG 96 04/27/2010 0747   HDL 55 04/27/2010 0747   CHOLHDL 4.1 04/27/2010 0747   VLDL 19  04/27/2010 0747   LDLCALC (H) 04/27/2010 0747    149        Total Cholesterol/HDL:CHD Risk Coronary Heart Disease Risk Table                     Men   Women  1/2 Average Risk   3.4   3.3  Average Risk       5.0   4.4  2 X Average Risk   9.6   7.1  3 X Average Risk  23.4   11.0        Use the calculated Patient Ratio above and the CHD Risk Table to determine the patient's CHD Risk.        ATP III CLASSIFICATION (LDL):  <100     mg/dL   Optimal  161-096  mg/dL   Near or Above                    Optimal  130-159  mg/dL   Borderline  045-409  mg/dL   High  >811     mg/dL   Very High    May 20, 2019 Cholesterol 141, HDL 39, LDL 65, triglycerides 187  May 21, 2020 Cholesterol 154, HDL 49, LDL 73, triglycerides 191  May 28, 2021 Cholesterol 167, HDL 42, LDL 82, triglycerides 260  June 03, 2022 Cholesterol 167, HDL 45, LDL 75, triglycerides 290 Hemoglobin A1c 6.5%, creatinine 0.9, potassium 4.1, ALT 19  June 05, 2023 Cholesterol 159, HDL 40, LDL 75, triglycerides 268   Physical Exam:    VS:  BP (!) 162/80 (BP Location: Right Arm, Patient Position: Sitting, Cuff Size: Large)   Pulse 75   Wt 245 lb (111.1 kg)   SpO2 98%   BMI 36.18 kg/m     Wt Readings from Last 3 Encounters:  11/16/23 245 lb (111.1 kg)  07/16/23 248 lb 14.4 oz (112.9 kg)  11/21/22 249 lb 3.2 oz (113 kg)    General: Alert, oriented x3, no distress, moderately obese, but also  very muscular and fit Head: no evidence of trauma, PERRL, EOMI, no exophtalmos or lid lag, no myxedema, no xanthelasma; normal ears, nose and oropharynx Neck: normal jugular venous pulsations and no hepatojugular reflux; brisk carotid pulses without delay and no carotid bruits Chest: clear to auscultation, no signs of consolidation by percussion or palpation, normal fremitus, symmetrical and full respiratory excursions Cardiovascular: normal position and quality of the apical impulse, regular rhythm, normal first and second heart sounds, no murmurs, rubs or gallops Abdomen: no tenderness or distention, no masses by palpation, no abnormal pulsatility or arterial bruits, normal bowel sounds, no hepatosplenomegaly Extremities: no clubbing, cyanosis or edema; 2+ radial, ulnar and brachial pulses bilaterally; 2+ right femoral, posterior tibial and dorsalis pedis pulses; 2+ left femoral, posterior tibial and dorsalis pedis pulses; no subclavian or femoral bruits Neurological: grossly nonfocal Psych: Normal mood and affect  ASSESSMENT:    1. Coronary artery disease involving coronary bypass graft of native heart without angina pectoris   2. Essential hypertension   3. Mixed hyperlipidemia   4. Type 2 diabetes mellitus with complication, without long-term current use of insulin (HCC)   5. Severe obesity (BMI 35.0-39.9) with comorbidity (HCC)   6. Trifascicular block      PLAN:    In order of problems listed above:  CAD s/p CABG: No angina when he exercises on a daily basis.  Most recent functional study was a low risk nuclear stress  test in 2018.  Not on beta-blockers due to past problems with bradycardia.  On aspirin.  Need to identify an effective lipid-lowering regimen. HTN: As before, his blood pressure is often a little higher in the doctor's office and at home.  I asked him to send me a log. HLP: He would he would like to try lovastatin, which is what his wife takes.  It is reasonable to try  this, but it may not be potent enough to bring his LDL to target under 70.  If that is not effective we will try rosuvastatin and we always have the option to switch him to PCSK9 inhibitors if necessary.   Obesity/DM2: Try to lose weight.  He needs to reduce the intake of sugars and starches with high glycemic index 5 and try to exercise more.  If he does require medications for type 2 diabetes mellitus, the best choices would be a GLP-1 agonist such as Ozempic or an SGLT2 inhibitor such as [place]. Conduction system disease: He does not have any symptoms of bradycardia and has not had any identified pauses.  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  Avoid beta-blockers, other medications negative chronotropic effect.  At this point there is no indication for pacemaker therapy.       Medication Adjustments/Labs and Tests Ordered: Current medicines are reviewed at length with the patient today.  Concerns regarding medicines are outlined above.  Orders Placed This Encounter  Procedures   EKG 12-Lead   Meds ordered this encounter  Medications   lovastatin (MEVACOR) 40 MG tablet    Sig: Take 1 tablet (40 mg total) by mouth at bedtime.    Dispense:  90 tablet    Refill:  3   hydrochlorothiazide (MICROZIDE) 12.5 MG capsule    Sig: Take 1 capsule (12.5 mg total) by mouth daily.    Dispense:  90 capsule    Refill:  3   Patient Instructions  Medication Instructions:  Start Lovastatin 40 mg every night (at dinner or bedtime) *If you need a refill on your cardiac medications before your next appointment, please call your pharmacy*  Keep a BP log for one week, then send in through MyChart  Lab Work: Lipid panel- with PCP in 6 weeks If you have labs (blood work) drawn today and your tests are completely normal, you will receive your results only by: MyChart Message (if you have MyChart) OR A  paper copy in the mail If you have any lab test that is abnormal or we need to change your treatment, we will call you to review the results.   Follow-Up: At [facility], you and your health needs are our priority.  As part of our continuing mission to provide you with exceptional heart care, our providers are all part of one team.  This team includes your primary Cardiologist (physician) and Advanced Practice Providers or APPs (Physician Assistants and Nurse Practitioners) who all work together to provide you with the care you need, when you need it.  Your next appointment:   1 year(s)  Provider:   Thurmon Fair, MD     We recommend signing up for the patient portal called "MyChart".  Sign up information is provided on this After Visit Summary.  MyChart is used to connect with patients for Virtual Visits (Telemedicine).  Patients are able to view lab/test results, encounter notes, upcoming appointments, etc.  Non-urgent messages can be sent to your provider as well.   To learn more about what you can do with MyChart, go to [email or web address].        1st Floor: - Lobby - Registration  - Pharmacy  - Lab - Cafe  2nd Floor: - PV Lab - Diagnostic Testing (echo, CT, nuclear med)  3rd Floor: - Vacant  4th Floor: - TCTS (cardiothoracic surgery) - AFib Clinic - Structural Heart Clinic - Vascular Surgery  - Vascular Ultrasound  5th Floor: - HeartCare Cardiology (general and EP) - Clinical Pharmacy for coumadin, hypertension, lipid, weight-loss medications, and med management appointments    Valet parking services will be available as well.     Signed, Thurmon Fair, MD  11/16/2023 10:36 AM    [facility] is so awful same hook again and again and again atorvastatin

## 2023-11-16 NOTE — Patient Instructions (Signed)
 Medication Instructions:  Start Lovastatin 40 mg every night (at dinner or bedtime) *If you need a refill on your cardiac medications before your next appointment, please call your pharmacy*  Keep a BP log for one week, then send in through MyChart  Lab Work: Lipid panel- with PCP in 6 weeks If you have labs (blood work) drawn today and your tests are completely normal, you will receive your results only by: MyChart Message (if you have MyChart) OR A paper copy in the mail If you have any lab test that is abnormal or we need to change your treatment, we will call you to review the results.   Follow-Up: At [facility], you and your health needs are our priority.  As part of our continuing mission to provide you with exceptional heart care, our providers are all part of one team.  This team includes your primary Cardiologist (physician) and Advanced Practice Providers or APPs (Physician Assistants and Nurse Practitioners) who all work together to provide you with the care you need, when you need it.  Your next appointment:   1 year(s)  Provider:   Thurmon Fair, MD     We recommend signing up for the patient portal called "MyChart".  Sign up information is provided on this After Visit Summary.  MyChart is used to connect with patients for Virtual Visits (Telemedicine).  Patients are able to view lab/test results, encounter notes, upcoming appointments, etc.  Non-urgent messages can be sent to your provider as well.   To learn more about what you can do with MyChart, go to [email or web address].        1st Floor: - Lobby - Registration  - Pharmacy  - Lab - Cafe  2nd Floor: - PV Lab - Diagnostic Testing (echo, CT, nuclear med)  3rd Floor: - Vacant  4th Floor: - TCTS (cardiothoracic surgery) - AFib Clinic - Structural Heart Clinic - Vascular Surgery  - Vascular Ultrasound  5th Floor: - HeartCare Cardiology (general and EP) - Clinical Pharmacy for coumadin,  hypertension, lipid, weight-loss medications, and med management appointments    Valet parking services will be available as well.

## 2023-11-26 DIAGNOSIS — I700 Atherosclerosis of aorta: Secondary | ICD-10-CM | POA: Diagnosis not present

## 2023-11-26 DIAGNOSIS — E1169 Type 2 diabetes mellitus with other specified complication: Secondary | ICD-10-CM | POA: Diagnosis not present

## 2023-11-26 DIAGNOSIS — E7800 Pure hypercholesterolemia, unspecified: Secondary | ICD-10-CM | POA: Diagnosis not present

## 2023-11-26 DIAGNOSIS — I70208 Unspecified atherosclerosis of native arteries of extremities, other extremity: Secondary | ICD-10-CM | POA: Diagnosis not present

## 2023-11-30 ENCOUNTER — Encounter: Payer: Self-pay | Admitting: Cardiovascular Disease

## 2024-01-05 DIAGNOSIS — G2581 Restless legs syndrome: Secondary | ICD-10-CM | POA: Diagnosis not present

## 2024-03-02 DIAGNOSIS — E1169 Type 2 diabetes mellitus with other specified complication: Secondary | ICD-10-CM | POA: Diagnosis not present

## 2024-03-18 DIAGNOSIS — E109 Type 1 diabetes mellitus without complications: Secondary | ICD-10-CM | POA: Diagnosis not present

## 2024-04-18 DIAGNOSIS — E109 Type 1 diabetes mellitus without complications: Secondary | ICD-10-CM | POA: Diagnosis not present

## 2024-04-28 DIAGNOSIS — H25813 Combined forms of age-related cataract, bilateral: Secondary | ICD-10-CM | POA: Diagnosis not present

## 2024-04-28 DIAGNOSIS — R7309 Other abnormal glucose: Secondary | ICD-10-CM | POA: Diagnosis not present

## 2024-05-18 DIAGNOSIS — E109 Type 1 diabetes mellitus without complications: Secondary | ICD-10-CM | POA: Diagnosis not present

## 2024-06-07 DIAGNOSIS — Z0000 Encounter for general adult medical examination without abnormal findings: Secondary | ICD-10-CM | POA: Diagnosis not present

## 2024-06-07 DIAGNOSIS — E7800 Pure hypercholesterolemia, unspecified: Secondary | ICD-10-CM | POA: Diagnosis not present

## 2024-06-07 DIAGNOSIS — E1169 Type 2 diabetes mellitus with other specified complication: Secondary | ICD-10-CM | POA: Diagnosis not present

## 2024-06-07 DIAGNOSIS — I2510 Atherosclerotic heart disease of native coronary artery without angina pectoris: Secondary | ICD-10-CM | POA: Diagnosis not present

## 2024-06-07 DIAGNOSIS — I70208 Unspecified atherosclerosis of native arteries of extremities, other extremity: Secondary | ICD-10-CM | POA: Diagnosis not present

## 2024-06-07 DIAGNOSIS — I700 Atherosclerosis of aorta: Secondary | ICD-10-CM | POA: Diagnosis not present

## 2024-06-07 DIAGNOSIS — Z1331 Encounter for screening for depression: Secondary | ICD-10-CM | POA: Diagnosis not present

## 2024-06-18 DIAGNOSIS — E109 Type 1 diabetes mellitus without complications: Secondary | ICD-10-CM | POA: Diagnosis not present

## 2024-07-08 DIAGNOSIS — Z23 Encounter for immunization: Secondary | ICD-10-CM | POA: Diagnosis not present

## 2024-08-29 ENCOUNTER — Encounter: Payer: Self-pay | Admitting: Cardiovascular Disease

## 2025-01-04 ENCOUNTER — Ambulatory Visit: Admitting: Cardiovascular Disease
